# Patient Record
Sex: Female | Born: 1970 | Race: White | Hispanic: No | Marital: Single | Smoking: Current every day smoker
Health system: Southern US, Community
[De-identification: ages and names within clinical notes are randomized; demographics above are authoritative.]

## PROBLEM LIST (undated history)

## (undated) ENCOUNTER — Emergency Department (HOSPITAL_BASED_OUTPATIENT_CLINIC_OR_DEPARTMENT_OTHER): Payer: Medicare Other | Source: Home / Self Care

## (undated) DIAGNOSIS — K219 Gastro-esophageal reflux disease without esophagitis: Secondary | ICD-10-CM

## (undated) DIAGNOSIS — F329 Major depressive disorder, single episode, unspecified: Secondary | ICD-10-CM

## (undated) DIAGNOSIS — M1990 Unspecified osteoarthritis, unspecified site: Secondary | ICD-10-CM

## (undated) DIAGNOSIS — I2699 Other pulmonary embolism without acute cor pulmonale: Secondary | ICD-10-CM

## (undated) DIAGNOSIS — T4145XA Adverse effect of unspecified anesthetic, initial encounter: Secondary | ICD-10-CM

## (undated) DIAGNOSIS — Z9109 Other allergy status, other than to drugs and biological substances: Secondary | ICD-10-CM

## (undated) DIAGNOSIS — G5600 Carpal tunnel syndrome, unspecified upper limb: Secondary | ICD-10-CM

## (undated) DIAGNOSIS — IMO0002 Reserved for concepts with insufficient information to code with codable children: Secondary | ICD-10-CM

## (undated) DIAGNOSIS — T8859XA Other complications of anesthesia, initial encounter: Secondary | ICD-10-CM

## (undated) DIAGNOSIS — I7300 Raynaud's syndrome without gangrene: Secondary | ICD-10-CM

## (undated) DIAGNOSIS — I8290 Acute embolism and thrombosis of unspecified vein: Secondary | ICD-10-CM

## (undated) DIAGNOSIS — I82409 Acute embolism and thrombosis of unspecified deep veins of unspecified lower extremity: Secondary | ICD-10-CM

## (undated) DIAGNOSIS — M5136 Other intervertebral disc degeneration, lumbar region: Secondary | ICD-10-CM

## (undated) DIAGNOSIS — F32A Depression, unspecified: Secondary | ICD-10-CM

## (undated) DIAGNOSIS — F319 Bipolar disorder, unspecified: Secondary | ICD-10-CM

## (undated) DIAGNOSIS — I10 Essential (primary) hypertension: Secondary | ICD-10-CM

## (undated) DIAGNOSIS — Z8709 Personal history of other diseases of the respiratory system: Secondary | ICD-10-CM

## (undated) DIAGNOSIS — F988 Other specified behavioral and emotional disorders with onset usually occurring in childhood and adolescence: Secondary | ICD-10-CM

## (undated) DIAGNOSIS — F419 Anxiety disorder, unspecified: Secondary | ICD-10-CM

## (undated) DIAGNOSIS — B019 Varicella without complication: Secondary | ICD-10-CM

## (undated) DIAGNOSIS — W3400XA Accidental discharge from unspecified firearms or gun, initial encounter: Secondary | ICD-10-CM

## (undated) DIAGNOSIS — D649 Anemia, unspecified: Secondary | ICD-10-CM

## (undated) DIAGNOSIS — M51369 Other intervertebral disc degeneration, lumbar region without mention of lumbar back pain or lower extremity pain: Secondary | ICD-10-CM

## (undated) DIAGNOSIS — K5900 Constipation, unspecified: Secondary | ICD-10-CM

## (undated) HISTORY — PX: TUBAL LIGATION: SHX77

## (undated) HISTORY — PX: COCCYGECTOMY: SHX5011

## (undated) HISTORY — PX: OTHER SURGICAL HISTORY: SHX169

## (undated) HISTORY — PX: BREAST BIOPSY: SHX20

## (undated) HISTORY — DX: Acute embolism and thrombosis of unspecified vein: I82.90

## (undated) HISTORY — DX: Other pulmonary embolism without acute cor pulmonale: I26.99

## (undated) HISTORY — DX: Raynaud's syndrome without gangrene: I73.00

## (undated) HISTORY — DX: Carpal tunnel syndrome, unspecified upper limb: G56.00

## (undated) HISTORY — DX: Other allergy status, other than to drugs and biological substances: Z91.09

## (undated) HISTORY — DX: Varicella without complication: B01.9

## (undated) HISTORY — DX: Acute embolism and thrombosis of unspecified deep veins of unspecified lower extremity: I82.409

## (undated) HISTORY — DX: Unspecified osteoarthritis, unspecified site: M19.90

---

## 2011-07-18 LAB — HM MAMMOGRAPHY

## 2011-09-28 LAB — HM PAP SMEAR

## 2012-09-27 DIAGNOSIS — W3400XA Accidental discharge from unspecified firearms or gun, initial encounter: Secondary | ICD-10-CM

## 2012-09-27 HISTORY — DX: Accidental discharge from unspecified firearms or gun, initial encounter: W34.00XA

## 2013-03-26 DIAGNOSIS — I742 Embolism and thrombosis of arteries of the upper extremities: Secondary | ICD-10-CM | POA: Insufficient documentation

## 2013-03-26 DIAGNOSIS — F17200 Nicotine dependence, unspecified, uncomplicated: Secondary | ICD-10-CM | POA: Insufficient documentation

## 2013-06-25 ENCOUNTER — Emergency Department (HOSPITAL_COMMUNITY)
Admission: EM | Admit: 2013-06-25 | Discharge: 2013-06-25 | Discharge status: Against Medical Advice | Payer: Medicare Other | Source: Home / Self Care

## 2013-06-25 ENCOUNTER — Emergency Department (HOSPITAL_COMMUNITY)
Admission: EM | Admit: 2013-06-25 | Discharge: 2013-06-26 | Disposition: A | Discharge status: Home / Self Care | Payer: Medicare Other | Attending: Emergency Medicine | Admitting: Emergency Medicine

## 2013-06-25 ENCOUNTER — Encounter (HOSPITAL_COMMUNITY): Payer: Self-pay | Admitting: Family Medicine

## 2013-06-25 ENCOUNTER — Encounter (HOSPITAL_COMMUNITY): Payer: Self-pay | Admitting: *Deleted

## 2013-06-25 ENCOUNTER — Emergency Department (HOSPITAL_COMMUNITY): Payer: Medicare Other

## 2013-06-25 DIAGNOSIS — S301XXA Contusion of abdominal wall, initial encounter: Secondary | ICD-10-CM | POA: Insufficient documentation

## 2013-06-25 DIAGNOSIS — F17200 Nicotine dependence, unspecified, uncomplicated: Secondary | ICD-10-CM | POA: Insufficient documentation

## 2013-06-25 DIAGNOSIS — IMO0002 Reserved for concepts with insufficient information to code with codable children: Secondary | ICD-10-CM | POA: Insufficient documentation

## 2013-06-25 DIAGNOSIS — Z8739 Personal history of other diseases of the musculoskeletal system and connective tissue: Secondary | ICD-10-CM | POA: Insufficient documentation

## 2013-06-25 DIAGNOSIS — F329 Major depressive disorder, single episode, unspecified: Secondary | ICD-10-CM | POA: Insufficient documentation

## 2013-06-25 DIAGNOSIS — T07XXXA Unspecified multiple injuries, initial encounter: Secondary | ICD-10-CM

## 2013-06-25 DIAGNOSIS — Z79899 Other long term (current) drug therapy: Secondary | ICD-10-CM | POA: Insufficient documentation

## 2013-06-25 DIAGNOSIS — F3289 Other specified depressive episodes: Secondary | ICD-10-CM | POA: Insufficient documentation

## 2013-06-25 DIAGNOSIS — Y92410 Unspecified street and highway as the place of occurrence of the external cause: Secondary | ICD-10-CM | POA: Insufficient documentation

## 2013-06-25 DIAGNOSIS — S161XXA Strain of muscle, fascia and tendon at neck level, initial encounter: Secondary | ICD-10-CM

## 2013-06-25 DIAGNOSIS — Z7902 Long term (current) use of antithrombotics/antiplatelets: Secondary | ICD-10-CM | POA: Insufficient documentation

## 2013-06-25 DIAGNOSIS — R110 Nausea: Secondary | ICD-10-CM | POA: Insufficient documentation

## 2013-06-25 DIAGNOSIS — S139XXA Sprain of joints and ligaments of unspecified parts of neck, initial encounter: Secondary | ICD-10-CM | POA: Insufficient documentation

## 2013-06-25 DIAGNOSIS — Y939 Activity, unspecified: Secondary | ICD-10-CM | POA: Insufficient documentation

## 2013-06-25 DIAGNOSIS — S0083XA Contusion of other part of head, initial encounter: Secondary | ICD-10-CM

## 2013-06-25 DIAGNOSIS — M542 Cervicalgia: Secondary | ICD-10-CM | POA: Insufficient documentation

## 2013-06-25 DIAGNOSIS — S0003XA Contusion of scalp, initial encounter: Secondary | ICD-10-CM | POA: Insufficient documentation

## 2013-06-25 DIAGNOSIS — Y9389 Activity, other specified: Secondary | ICD-10-CM | POA: Insufficient documentation

## 2013-06-25 DIAGNOSIS — R000 Tachycardia, unspecified: Secondary | ICD-10-CM | POA: Insufficient documentation

## 2013-06-25 DIAGNOSIS — Z7982 Long term (current) use of aspirin: Secondary | ICD-10-CM | POA: Insufficient documentation

## 2013-06-25 HISTORY — DX: Depression, unspecified: F32.A

## 2013-06-25 HISTORY — DX: Major depressive disorder, single episode, unspecified: F32.9

## 2013-06-25 HISTORY — DX: Reserved for concepts with insufficient information to code with codable children: IMO0002

## 2013-06-25 LAB — POCT I-STAT, CHEM 8
BUN: 17 mg/dL (ref 6–23)
Calcium, Ion: 1.13 mmol/L (ref 1.12–1.23)
Creatinine, Ser: 0.8 mg/dL (ref 0.50–1.10)
TCO2: 22 mmol/L (ref 0–100)

## 2013-06-25 MED ORDER — HYDROMORPHONE HCL PF 1 MG/ML IJ SOLN
1.0000 mg | Freq: Once | INTRAMUSCULAR | Status: AC
Start: 2013-06-25 — End: 2013-06-25
  Administered 2013-06-25: 1 mg via INTRAMUSCULAR

## 2013-06-25 MED ORDER — HYDROMORPHONE HCL PF 1 MG/ML IJ SOLN
1.0000 mg | Freq: Once | INTRAMUSCULAR | Status: DC
  Filled 2013-06-25: qty 1

## 2013-06-25 MED ORDER — ONDANSETRON HCL 4 MG/2ML IJ SOLN: 4.0000 mg | Freq: Once | INTRAMUSCULAR | Status: DC

## 2013-06-25 MED ORDER — ONDANSETRON 4 MG PO TBDP
4.0000 mg | ORAL_TABLET | Freq: Once | ORAL | Status: AC
  Administered 2013-06-25: 4 mg via ORAL
  Filled 2013-06-25: qty 1
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
## 2013-06-25 NOTE — ED Notes (Signed)
Attempted to draw pts labs was unsuccessful called lab  

## 2013-06-25 NOTE — ED Notes (Signed)
Pt eating crackers and drinking iced coffee.  Instructed pt that she should not be eating or drinking until seen by MD.
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
## 2013-06-26 ENCOUNTER — Telehealth: Payer: Self-pay | Admitting: Family Medicine

## 2013-06-26 LAB — CBC WITH DIFFERENTIAL/PLATELET
Basophils Absolute: 0 10*3/uL (ref 0.0–0.1)
Basophils Relative: 0 % (ref 0–1)
Eosinophils Relative: 1 % (ref 0–5)
HCT: 38.9 % (ref 36.0–46.0)
Lymphocytes Relative: 27 % (ref 12–46)
Lymphs Abs: 2.8 10*3/uL (ref 0.7–4.0)
MCH: 31.6 pg (ref 26.0–34.0)
MCHC: 34.2 g/dL (ref 30.0–36.0)
MCV: 92.4 fL (ref 78.0–100.0)
Monocytes Absolute: 0.6 10*3/uL (ref 0.1–1.0)
RBC: 4.21 MIL/uL (ref 3.87–5.11)
RDW: 13.9 % (ref 11.5–15.5)
WBC: 10.2 10*3/uL (ref 4.0–10.5)

## 2013-06-26 MED ORDER — OXYCODONE-ACETAMINOPHEN 5-325 MG PO TABS
2.0000 | ORAL_TABLET | Freq: Once | ORAL | Status: AC
  Administered 2013-06-26: 2 via ORAL
  Filled 2013-06-26: qty 2

## 2013-06-26 MED ORDER — CARISOPRODOL 350 MG PO TABS: 350.0000 mg | ORAL_TABLET | Freq: Three times a day (TID) | ORAL | Status: DC | PRN

## 2013-06-26 MED ORDER — CARISOPRODOL 350 MG PO TABS
350.0000 mg | ORAL_TABLET | Freq: Once | ORAL | Status: AC
  Administered 2013-06-26: 350 mg via ORAL
  Filled 2013-06-26: qty 1

## 2013-06-26 NOTE — ED Notes (Signed)
Pt  Refused urine specimen gail np made aware

## 2013-06-26 NOTE — Telephone Encounter (Signed)
Received 5 pages from [facility], sent to [facility] on 06/26/13/ss

## 2013-06-27 ENCOUNTER — Telehealth: Payer: Self-pay

## 2013-06-27 NOTE — Telephone Encounter (Signed)
LM for CB No HM/need records.ROI

## 2013-06-28 ENCOUNTER — Ambulatory Visit: Payer: Self-pay | Admitting: Family Medicine

## 2013-06-28 DIAGNOSIS — Z0289 Encounter for other administrative examinations: Secondary | ICD-10-CM

## 2013-06-28 NOTE — ED Provider Notes (Signed)
Medical screening examination/treatment/procedure(s) were performed by non-physician practitioner and as supervising physician I was immediately available for consultation/collaboration.   Dagmar Hait, MD 06/28/13 [phone]

## 2013-06-28 NOTE — Telephone Encounter (Signed)
Unable to reach Pre-Visit.   

## 2013-07-01 ENCOUNTER — Encounter (HOSPITAL_COMMUNITY): Payer: Self-pay | Admitting: Emergency Medicine

## 2013-07-01 ENCOUNTER — Emergency Department (HOSPITAL_COMMUNITY)
Admission: EM | Admit: 2013-07-01 | Discharge: 2013-07-01 | Disposition: A | Discharge status: Home / Self Care | Payer: Medicare Other | Attending: Emergency Medicine | Admitting: Emergency Medicine

## 2013-07-01 DIAGNOSIS — Z7902 Long term (current) use of antithrombotics/antiplatelets: Secondary | ICD-10-CM | POA: Insufficient documentation

## 2013-07-01 DIAGNOSIS — Y939 Activity, unspecified: Secondary | ICD-10-CM | POA: Insufficient documentation

## 2013-07-01 DIAGNOSIS — S71009A Unspecified open wound, unspecified hip, initial encounter: Secondary | ICD-10-CM | POA: Insufficient documentation

## 2013-07-01 DIAGNOSIS — F17200 Nicotine dependence, unspecified, uncomplicated: Secondary | ICD-10-CM | POA: Insufficient documentation

## 2013-07-01 DIAGNOSIS — F3289 Other specified depressive episodes: Secondary | ICD-10-CM | POA: Insufficient documentation

## 2013-07-01 DIAGNOSIS — Z79899 Other long term (current) drug therapy: Secondary | ICD-10-CM | POA: Insufficient documentation

## 2013-07-01 DIAGNOSIS — Y929 Unspecified place or not applicable: Secondary | ICD-10-CM | POA: Insufficient documentation

## 2013-07-01 DIAGNOSIS — Z8739 Personal history of other diseases of the musculoskeletal system and connective tissue: Secondary | ICD-10-CM | POA: Insufficient documentation

## 2013-07-01 DIAGNOSIS — F329 Major depressive disorder, single episode, unspecified: Secondary | ICD-10-CM | POA: Insufficient documentation

## 2013-07-01 DIAGNOSIS — T798XXA Other early complications of trauma, initial encounter: Secondary | ICD-10-CM

## 2013-07-01 DIAGNOSIS — W3400XA Accidental discharge from unspecified firearms or gun, initial encounter: Secondary | ICD-10-CM | POA: Insufficient documentation

## 2013-07-01 DIAGNOSIS — S71109A Unspecified open wound, unspecified thigh, initial encounter: Secondary | ICD-10-CM | POA: Insufficient documentation

## 2013-07-01 DIAGNOSIS — Z7982 Long term (current) use of aspirin: Secondary | ICD-10-CM | POA: Insufficient documentation

## 2013-07-01 MED ORDER — SULFAMETHOXAZOLE-TMP DS 800-160 MG PO TABS: 1.0000 | ORAL_TABLET | Freq: Two times a day (BID) | ORAL | Status: DC

## 2013-07-01 MED ORDER — CEPHALEXIN 500 MG PO CAPS: ORAL_CAPSULE | ORAL | Status: DC

## 2013-07-01 MED ORDER — FLUCONAZOLE 200 MG PO TABS: 200.0000 mg | ORAL_TABLET | Freq: Every day | ORAL | Status: DC
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
## 2013-07-01 NOTE — ED Notes (Signed)
New dressing applied to wound.
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
## 2013-07-10 ENCOUNTER — Encounter (HOSPITAL_BASED_OUTPATIENT_CLINIC_OR_DEPARTMENT_OTHER): Payer: Self-pay | Admitting: Emergency Medicine

## 2013-07-10 ENCOUNTER — Emergency Department (HOSPITAL_BASED_OUTPATIENT_CLINIC_OR_DEPARTMENT_OTHER)
Admission: EM | Admit: 2013-07-10 | Discharge: 2013-07-10 | Disposition: A | Discharge status: Home / Self Care | Payer: Medicare Other | Attending: Emergency Medicine | Admitting: Emergency Medicine

## 2013-07-10 DIAGNOSIS — F17200 Nicotine dependence, unspecified, uncomplicated: Secondary | ICD-10-CM | POA: Insufficient documentation

## 2013-07-10 DIAGNOSIS — Z8739 Personal history of other diseases of the musculoskeletal system and connective tissue: Secondary | ICD-10-CM | POA: Insufficient documentation

## 2013-07-10 DIAGNOSIS — Z4801 Encounter for change or removal of surgical wound dressing: Secondary | ICD-10-CM | POA: Insufficient documentation

## 2013-07-10 DIAGNOSIS — Z7902 Long term (current) use of antithrombotics/antiplatelets: Secondary | ICD-10-CM | POA: Insufficient documentation

## 2013-07-10 DIAGNOSIS — R269 Unspecified abnormalities of gait and mobility: Secondary | ICD-10-CM | POA: Insufficient documentation

## 2013-07-10 DIAGNOSIS — Z7982 Long term (current) use of aspirin: Secondary | ICD-10-CM | POA: Insufficient documentation

## 2013-07-10 DIAGNOSIS — Z79899 Other long term (current) drug therapy: Secondary | ICD-10-CM | POA: Insufficient documentation

## 2013-07-10 DIAGNOSIS — Z5189 Encounter for other specified aftercare: Secondary | ICD-10-CM

## 2013-07-10 HISTORY — DX: Accidental discharge from unspecified firearms or gun, initial encounter: W34.00XA

## 2013-07-10 LAB — CBC WITH DIFFERENTIAL/PLATELET
Basophils Absolute: 0 10*3/uL (ref 0.0–0.1)
Basophils Relative: 1 % (ref 0–1)
Eosinophils Absolute: 0.1 10*3/uL (ref 0.0–0.7)
MCH: 31.3 pg (ref 26.0–34.0)
MCHC: 33.2 g/dL (ref 30.0–36.0)
Neutrophils Relative %: 48 % (ref 43–77)
Platelets: 367 10*3/uL (ref 150–400)
RDW: 13.2 % (ref 11.5–15.5)

## 2013-07-10 LAB — BASIC METABOLIC PANEL
BUN: 14 mg/dL (ref 6–23)
Calcium: 9.3 mg/dL (ref 8.4–10.5)
Creatinine, Ser: 0.8 mg/dL (ref 0.50–1.10)
GFR calc non Af Amer: 88 mL/min — ABNORMAL LOW (ref >90)
Glucose, Bld: 78 mg/dL (ref 70–99)
Sodium: 137 mEq/L (ref 135–145)

## 2013-07-10 MED ORDER — OXYCODONE-ACETAMINOPHEN 5-325 MG PO TABS: 1.0000 | ORAL_TABLET | Freq: Four times a day (QID) | ORAL | Status: DC | PRN
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
## 2013-07-10 NOTE — ED Notes (Signed)
MD at bedside. 
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
## 2013-07-12 ENCOUNTER — Telehealth (HOSPITAL_COMMUNITY): Payer: Self-pay | Admitting: Emergency Medicine
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
## 2013-07-13 ENCOUNTER — Telehealth: Payer: Self-pay

## 2013-07-13 NOTE — Telephone Encounter (Addendum)
LM fo CB Unable to reach prior to visit

## 2013-07-16 NOTE — ED Provider Notes (Signed)
Medical screening examination/treatment/procedure(s) were conducted as a shared visit with non-physician practitioner(s) and myself.  I personally evaluated the patient during the encounter Pt with draining wound without signs of infection or abscess.  Recently completed abx.  Pt has been standing for 16 hours a day and continues to pack the wound.  Will continue to pack and send to trauma clinic  Gwyneth Sprout, MD 07/16/13 [phone]

## 2013-07-17 ENCOUNTER — Ambulatory Visit (INDEPENDENT_AMBULATORY_CARE_PROVIDER_SITE_OTHER): Payer: Medicare Other | Admitting: Family Medicine

## 2013-07-17 ENCOUNTER — Encounter: Payer: Self-pay | Admitting: Family Medicine

## 2013-07-17 VITALS — BP 100/68 | HR 94 | Temp 98.2°F | Ht 69.5 in | Wt 161.4 lb

## 2013-07-17 DIAGNOSIS — F411 Generalized anxiety disorder: Secondary | ICD-10-CM

## 2013-07-17 DIAGNOSIS — G894 Chronic pain syndrome: Secondary | ICD-10-CM

## 2013-07-17 DIAGNOSIS — I7300 Raynaud's syndrome without gangrene: Secondary | ICD-10-CM

## 2013-07-17 DIAGNOSIS — G5600 Carpal tunnel syndrome, unspecified upper limb: Secondary | ICD-10-CM

## 2013-07-17 DIAGNOSIS — F988 Other specified behavioral and emotional disorders with onset usually occurring in childhood and adolescence: Secondary | ICD-10-CM

## 2013-07-17 DIAGNOSIS — M549 Dorsalgia, unspecified: Secondary | ICD-10-CM

## 2013-07-17 DIAGNOSIS — B009 Herpesviral infection, unspecified: Secondary | ICD-10-CM

## 2013-07-17 MED ORDER — OXYCODONE HCL ER 20 MG PO T12A: 20.0000 mg | EXTENDED_RELEASE_TABLET | Freq: Three times a day (TID) | ORAL | Status: DC

## 2013-07-17 MED ORDER — ALPRAZOLAM 1 MG PO TABS: 1.0000 mg | ORAL_TABLET | Freq: Three times a day (TID) | ORAL | Status: DC

## 2013-07-17 MED ORDER — LAMOTRIGINE 200 MG PO TABS: 400.0000 mg | ORAL_TABLET | Freq: Every day | ORAL | Status: AC

## 2013-07-17 MED ORDER — CARISOPRODOL 350 MG PO TABS: 350.0000 mg | ORAL_TABLET | Freq: Three times a day (TID) | ORAL | Status: DC | PRN

## 2013-07-17 MED ORDER — AMLODIPINE BESYLATE 2.5 MG PO TABS: 2.5000 mg | ORAL_TABLET | Freq: Every day | ORAL | Status: DC

## 2013-07-17 MED ORDER — AMPHETAMINE-DEXTROAMPHETAMINE 20 MG PO TABS: 20.0000 mg | ORAL_TABLET | Freq: Two times a day (BID) | ORAL | Status: DC

## 2013-07-17 MED ORDER — CLOPIDOGREL BISULFATE 75 MG PO TABS: 75.0000 mg | ORAL_TABLET | Freq: Every day | ORAL | Status: DC

## 2013-07-17 MED ORDER — TEMAZEPAM 7.5 MG PO CAPS: 7.5000 mg | ORAL_CAPSULE | Freq: Every evening | ORAL | Status: AC | PRN

## 2013-07-17 MED ORDER — VALACYCLOVIR HCL 500 MG PO TABS: 500.0000 mg | ORAL_TABLET | Freq: Every day | ORAL | Status: AC

## 2013-07-17 MED ORDER — OXYCODONE-ACETAMINOPHEN 10-325 MG PO TABS: 1.0000 | ORAL_TABLET | Freq: Three times a day (TID) | ORAL | Status: DC | PRN

## 2013-07-17 NOTE — Assessment & Plan Note (Signed)
con't splints Refer to hand surgery
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
## 2013-07-17 NOTE — Assessment & Plan Note (Addendum)
Refill meds Refer to pain management

## 2013-07-17 NOTE — Patient Instructions (Signed)
Carpal Tunnel Syndrome You may have carpal tunnel syndrome. This is a common condition. Carpal tunnel syndrome occurs when the tendons, bones, or ligaments in the wrist press against the median nerve as it passes into the hand.  Symptoms can include:  Intermittent numbness.   Pain or a tingling sensation in thumb and first two fingers.  The pain may radiate up to the shoulder. There may even be weakness in the hand muscles. The pain is often worse at night and in the early morning. Nerve conduction tests may be used to prove the diagnosis. Carpal tunnel syndrome is most often due to repeated movements of the hand or wrist. Other causes can include:  Prior injuries.   Diabetes.   Obesity.   Smoking.   Pregnancy. Symptoms that develop during pregnancy often stop when the pregnancy is over.  Treatment includes:  Splinting - A wrist splint helps prevent movements that irritate the nerve. Splints are especially helpful at night when the symptoms are often worse.   Ice packs - Cold packs applied to the palm side of the wrist for 20 minutes every 2 hours while awake may give some relief.   Medication - Medicine to reduce inflammation and pain are often used. Cortisone injections around the nerve may also bring improvement.  Severe cases of carpal tunnel syndrome can require surgery to relieve the pressure on the nerve. This may be necessary if there is evidence of weakness or decreased sensation in your hand, or if your symptoms do not improve with conservative treatment. See your caregiver for follow-up to be certain your condition is improving. Document Released: 10/21/2004 Document Revised: 05/26/2011 Document Reviewed: 07/20/2007 [employer] Patient Information 2012 [employer]. 

## 2013-07-17 NOTE — Assessment & Plan Note (Signed)
Cont norvasc 

## 2013-07-25 ENCOUNTER — Other Ambulatory Visit: Payer: Self-pay | Admitting: Family Medicine

## 2013-07-25 DIAGNOSIS — M549 Dorsalgia, unspecified: Secondary | ICD-10-CM

## 2013-08-16 ENCOUNTER — Ambulatory Visit: Payer: Medicare Other | Admitting: Family Medicine

## 2013-08-16 ENCOUNTER — Telehealth: Payer: Self-pay | Admitting: *Deleted

## 2013-08-16 DIAGNOSIS — Z0289 Encounter for other administrative examinations: Secondary | ICD-10-CM

## 2013-08-16 NOTE — Telephone Encounter (Signed)
Spoke with pt after she missed her appt with Dr. Laury Axon today for medication follow up. Patient arrived over 30 minutes late to the appt and wasd told she needed to reschedule until tomorrow. Pt stated that she could not come back tomorrow due to her work schedule. Pt then asked if she could sit and wait in the lobby in the event another pt called and cancelled their appt, I advised that could not happen as we do not accept walk-in appts here. Patient then asked if she could come in the morning and sit and wait for a possible cancellation, I again advised no. I advised the patient that long term medications that are presribed regularly need to be monitored by the prescribing provider, it was not appropriate to have another physician do this (pt was new to establish in Oct w/ Dr. Laury Axon). Patient then asked if she changed providers could she get her medication today. I advised she could change providers that Dr. Laury Axon would no longer prescribe these medications for her and would no longer be her PCP. Patient verbalized understanding of this. Patient then stated that she would go back up front and make an appt with another provider.

## 2013-08-17 ENCOUNTER — Encounter: Payer: Self-pay | Admitting: Family Medicine

## 2013-08-17 ENCOUNTER — Encounter: Payer: Self-pay | Admitting: *Deleted

## 2013-08-17 ENCOUNTER — Ambulatory Visit (INDEPENDENT_AMBULATORY_CARE_PROVIDER_SITE_OTHER): Payer: Medicare Other | Admitting: Physician Assistant

## 2013-08-17 ENCOUNTER — Encounter: Payer: Self-pay | Admitting: Physician Assistant

## 2013-08-17 VITALS — BP 124/88 | HR 86 | Temp 98.2°F | Resp 16 | Ht 68.0 in | Wt 169.0 lb

## 2013-08-17 DIAGNOSIS — M549 Dorsalgia, unspecified: Secondary | ICD-10-CM

## 2013-08-17 DIAGNOSIS — Z79891 Long term (current) use of opiate analgesic: Secondary | ICD-10-CM

## 2013-08-17 DIAGNOSIS — J329 Chronic sinusitis, unspecified: Secondary | ICD-10-CM

## 2013-08-17 DIAGNOSIS — F988 Other specified behavioral and emotional disorders with onset usually occurring in childhood and adolescence: Secondary | ICD-10-CM

## 2013-08-17 DIAGNOSIS — G5600 Carpal tunnel syndrome, unspecified upper limb: Secondary | ICD-10-CM

## 2013-08-17 DIAGNOSIS — Z79899 Other long term (current) drug therapy: Secondary | ICD-10-CM

## 2013-08-17 DIAGNOSIS — Z0000 Encounter for general adult medical examination without abnormal findings: Secondary | ICD-10-CM

## 2013-08-17 DIAGNOSIS — I7300 Raynaud's syndrome without gangrene: Secondary | ICD-10-CM

## 2013-08-17 DIAGNOSIS — G894 Chronic pain syndrome: Secondary | ICD-10-CM

## 2013-08-17 LAB — URINALYSIS, ROUTINE W REFLEX MICROSCOPIC
Bilirubin Urine: NEGATIVE
Ketones, ur: NEGATIVE mg/dL
Leukocytes, UA: NEGATIVE
Nitrite: NEGATIVE
Specific Gravity, Urine: 1.007 (ref 1.005–1.030)
Urobilinogen, UA: 0.2 mg/dL (ref 0.0–1.0)

## 2013-08-17 LAB — POCT URINALYSIS DIPSTICK
Ketones, UA: NEGATIVE
Leukocytes, UA: NEGATIVE
Nitrite, UA: NEGATIVE
Protein, UA: NEGATIVE
Urobilinogen, UA: 0.2
pH, UA: 7

## 2013-08-17 MED ORDER — AMPHETAMINE-DEXTROAMPHETAMINE 20 MG PO TABS: 20.0000 mg | ORAL_TABLET | Freq: Two times a day (BID) | ORAL | Status: DC

## 2013-08-17 MED ORDER — OXYCODONE HCL ER 20 MG PO T12A: 20.0000 mg | EXTENDED_RELEASE_TABLET | Freq: Two times a day (BID) | ORAL | Status: DC

## 2013-08-17 MED ORDER — ALBUTEROL SULFATE HFA 108 (90 BASE) MCG/ACT IN AERS: 2.0000 | INHALATION_SPRAY | Freq: Four times a day (QID) | RESPIRATORY_TRACT | Status: AC | PRN

## 2013-08-17 MED ORDER — AMOXICILLIN-POT CLAVULANATE 875-125 MG PO TABS: 1.0000 | ORAL_TABLET | Freq: Two times a day (BID) | ORAL | Status: DC

## 2013-08-17 MED ORDER — OXYCODONE HCL ER 20 MG PO T12A: 20.0000 mg | EXTENDED_RELEASE_TABLET | Freq: Three times a day (TID) | ORAL | Status: DC

## 2013-08-17 MED ORDER — OXYCODONE-ACETAMINOPHEN 10-325 MG PO TABS: 1.0000 | ORAL_TABLET | Freq: Three times a day (TID) | ORAL | Status: DC | PRN

## 2013-08-17 MED ORDER — NAPROXEN 500 MG PO TABS: 500.0000 mg | ORAL_TABLET | Freq: Two times a day (BID) | ORAL | Status: DC | PRN

## 2013-08-17 NOTE — Progress Notes (Signed)
Pre visit review using our clinic review tool, if applicable. No additional management support is needed unless otherwise documented below in the visit note/SLS  

## 2013-08-17 NOTE — Patient Instructions (Signed)
Please obtain labs.  I will call you with your results.  Please follow-up with Orthopedics as scheduled.  I will check into your pain management referral.  We do not do chronic pain management so future pain medications will need to come from Ortho or Pain management Specialist.
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
## 2013-08-22 DIAGNOSIS — F988 Other specified behavioral and emotional disorders with onset usually occurring in childhood and adolescence: Secondary | ICD-10-CM | POA: Insufficient documentation

## 2013-08-22 DIAGNOSIS — Z0000 Encounter for general adult medical examination without abnormal findings: Secondary | ICD-10-CM | POA: Insufficient documentation

## 2013-08-22 DIAGNOSIS — J329 Chronic sinusitis, unspecified: Secondary | ICD-10-CM | POA: Insufficient documentation

## 2013-08-22 NOTE — Assessment & Plan Note (Signed)
Refill Adderall.  Patient filled out Controlled-substance agreement with our practice.

## 2013-08-22 NOTE — Assessment & Plan Note (Signed)
Will obtain labs.  Immunizations UTD.

## 2013-08-22 NOTE — Assessment & Plan Note (Signed)
Asymptomatic at present.  Has appointment with hand surgeon.

## 2013-08-22 NOTE — Assessment & Plan Note (Signed)
Follow-up with orthopedics.  Refilled meds.  Patient informed we do not do chronic pain management with narcotics at our practice.  Will possibly need pain management referral.

## 2013-08-22 NOTE — Assessment & Plan Note (Signed)
Rx Augmentin.  Increase fluids.  Rest.  Saline nasal spray.  Probiotic.  Humidifier in bedroom.

## 2013-08-22 NOTE — Assessment & Plan Note (Signed)
Continue current meds 

## 2013-09-14 ENCOUNTER — Ambulatory Visit: Payer: Medicare Other | Admitting: Physician Assistant

## 2013-09-14 ENCOUNTER — Other Ambulatory Visit: Payer: Self-pay | Admitting: Physician Assistant

## 2013-09-14 LAB — COMPREHENSIVE METABOLIC PANEL
ALT: 15 U/L (ref 0–35)
AST: 17 U/L (ref 0–37)
Albumin: 4.1 g/dL (ref 3.5–5.2)
Alkaline Phosphatase: 53 U/L (ref 39–117)
BUN: 11 mg/dL (ref 6–23)
CO2: 26 mEq/L (ref 19–32)
Calcium: 8.9 mg/dL (ref 8.4–10.5)
Chloride: 104 mEq/L (ref 96–112)
Creat: 0.72 mg/dL (ref 0.50–1.10)
Potassium: 4.2 mEq/L (ref 3.5–5.3)
Total Bilirubin: 0.5 mg/dL (ref 0.3–1.2)

## 2013-09-14 LAB — CBC WITH DIFFERENTIAL/PLATELET
Basophils Absolute: 0 10*3/uL (ref 0.0–0.1)
Basophils Relative: 1 % (ref 0–1)
Eosinophils Absolute: 0.1 10*3/uL (ref 0.0–0.7)
Lymphocytes Relative: 30 % (ref 12–46)
MCHC: 33.7 g/dL (ref 30.0–36.0)
Monocytes Absolute: 0.5 10*3/uL (ref 0.1–1.0)
Monocytes Relative: 7 % (ref 3–12)
Neutro Abs: 4 10*3/uL (ref 1.7–7.7)
Platelets: 265 10*3/uL (ref 150–400)
RDW: 14.2 % (ref 11.5–15.5)
WBC: 6.5 10*3/uL (ref 4.0–10.5)

## 2013-09-17 ENCOUNTER — Ambulatory Visit: Payer: Medicare Other | Admitting: Physician Assistant

## 2013-09-17 ENCOUNTER — Ambulatory Visit (INDEPENDENT_AMBULATORY_CARE_PROVIDER_SITE_OTHER): Payer: Medicare Other | Admitting: Physician Assistant

## 2013-09-17 ENCOUNTER — Encounter: Payer: Self-pay | Admitting: Physician Assistant

## 2013-09-17 VITALS — BP 128/82 | HR 91 | Temp 98.6°F | Resp 14 | Ht 68.0 in | Wt 160.5 lb

## 2013-09-17 DIAGNOSIS — F411 Generalized anxiety disorder: Secondary | ICD-10-CM

## 2013-09-17 DIAGNOSIS — M549 Dorsalgia, unspecified: Secondary | ICD-10-CM

## 2013-09-17 DIAGNOSIS — F329 Major depressive disorder, single episode, unspecified: Secondary | ICD-10-CM

## 2013-09-17 DIAGNOSIS — G5600 Carpal tunnel syndrome, unspecified upper limb: Secondary | ICD-10-CM

## 2013-09-17 DIAGNOSIS — F988 Other specified behavioral and emotional disorders with onset usually occurring in childhood and adolescence: Secondary | ICD-10-CM

## 2013-09-17 DIAGNOSIS — G894 Chronic pain syndrome: Secondary | ICD-10-CM

## 2013-09-17 MED ORDER — OXYCODONE HCL ER 20 MG PO T12A: 20.0000 mg | EXTENDED_RELEASE_TABLET | Freq: Two times a day (BID) | ORAL | Status: DC

## 2013-09-17 MED ORDER — ALPRAZOLAM 1 MG PO TABS: 1.0000 mg | ORAL_TABLET | Freq: Three times a day (TID) | ORAL | Status: DC

## 2013-09-17 MED ORDER — OXYCODONE-ACETAMINOPHEN 10-325 MG PO TABS: 1.0000 | ORAL_TABLET | Freq: Three times a day (TID) | ORAL | Status: DC | PRN

## 2013-09-17 NOTE — Progress Notes (Signed)
Pre visit review using our clinic review tool, if applicable. No additional management support is needed unless otherwise documented below in the visit note/SLS  
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
## 2013-09-18 ENCOUNTER — Telehealth: Payer: Self-pay | Admitting: *Deleted

## 2013-09-18 DIAGNOSIS — F988 Other specified behavioral and emotional disorders with onset usually occurring in childhood and adolescence: Secondary | ICD-10-CM

## 2013-09-18 DIAGNOSIS — F329 Major depressive disorder, single episode, unspecified: Secondary | ICD-10-CM | POA: Insufficient documentation

## 2013-09-18 NOTE — Assessment & Plan Note (Signed)
Surgery scheduled in January.

## 2013-09-18 NOTE — Assessment & Plan Note (Signed)
-   Refill Adderall

## 2013-09-18 NOTE — Assessment & Plan Note (Signed)
Refill narcotics. No additional refills will be given without specialist evaluation. Patient voices understanding. Referral to pain management placed. Patient to reschedule her appointment with orthopedic surgeon.

## 2013-09-18 NOTE — Assessment & Plan Note (Signed)
Refill Xanax. Will not prescribe the Xanax and Restoril together without specialist evaluation.

## 2013-09-18 NOTE — Telephone Encounter (Signed)
Patient requesting Adderall Rx, she forgot to get this at 12.22.14 OV/SLS Please Advise.

## 2013-09-19 MED ORDER — AMPHETAMINE-DEXTROAMPHETAMINE 20 MG PO TABS: 20.0000 mg | ORAL_TABLET | Freq: Two times a day (BID) | ORAL | Status: DC

## 2013-09-19 NOTE — Telephone Encounter (Signed)
Rx has been forwarded to front desk for pt p/u, patient is aware/SLS

## 2013-09-19 NOTE — Telephone Encounter (Signed)
Refill given, printed, signed and handed to nursing staff.

## 2013-10-01 ENCOUNTER — Other Ambulatory Visit: Payer: Self-pay | Admitting: Family Medicine

## 2013-10-18 ENCOUNTER — Encounter: Payer: Self-pay | Admitting: Physician Assistant

## 2013-10-18 ENCOUNTER — Ambulatory Visit (INDEPENDENT_AMBULATORY_CARE_PROVIDER_SITE_OTHER): Payer: Medicare Other | Admitting: Physician Assistant

## 2013-10-18 VITALS — BP 130/82 | HR 99 | Temp 98.0°F | Wt 164.8 lb

## 2013-10-18 DIAGNOSIS — F3289 Other specified depressive episodes: Secondary | ICD-10-CM

## 2013-10-18 DIAGNOSIS — F988 Other specified behavioral and emotional disorders with onset usually occurring in childhood and adolescence: Secondary | ICD-10-CM

## 2013-10-18 DIAGNOSIS — J309 Allergic rhinitis, unspecified: Secondary | ICD-10-CM

## 2013-10-18 DIAGNOSIS — G894 Chronic pain syndrome: Secondary | ICD-10-CM

## 2013-10-18 DIAGNOSIS — R21 Rash and other nonspecific skin eruption: Secondary | ICD-10-CM

## 2013-10-18 DIAGNOSIS — F329 Major depressive disorder, single episode, unspecified: Secondary | ICD-10-CM

## 2013-10-18 DIAGNOSIS — F32A Depression, unspecified: Secondary | ICD-10-CM

## 2013-10-18 MED ORDER — AMPHETAMINE-DEXTROAMPHETAMINE 20 MG PO TABS: 20.0000 mg | ORAL_TABLET | Freq: Two times a day (BID) | ORAL | Status: DC

## 2013-10-18 MED ORDER — CLOTRIMAZOLE-BETAMETHASONE 1-0.05 % EX CREA
1.0000 | TOPICAL_CREAM | Freq: Two times a day (BID) | CUTANEOUS | Status: AC
Start: 2013-10-18 — End: ?

## 2013-10-18 MED ORDER — FLUTICASONE PROPIONATE 50 MCG/ACT NA SUSP: 2.0000 | Freq: Every day | NASAL | Status: DC

## 2013-10-18 MED ORDER — OXYCODONE HCL ER 20 MG PO T12A: 20.0000 mg | EXTENDED_RELEASE_TABLET | Freq: Two times a day (BID) | ORAL | Status: DC

## 2013-10-18 MED ORDER — OXYCODONE-ACETAMINOPHEN 10-325 MG PO TABS: 1.0000 | ORAL_TABLET | Freq: Three times a day (TID) | ORAL | Status: DC | PRN

## 2013-10-24 DIAGNOSIS — G894 Chronic pain syndrome: Secondary | ICD-10-CM | POA: Insufficient documentation

## 2013-10-24 DIAGNOSIS — R21 Rash and other nonspecific skin eruption: Secondary | ICD-10-CM | POA: Insufficient documentation

## 2013-10-24 NOTE — Assessment & Plan Note (Signed)
Meds refilled.  Will refer patient to a different Pain Clinic.

## 2013-10-24 NOTE — Assessment & Plan Note (Signed)
-   Refill Adderall

## 2013-10-24 NOTE — Assessment & Plan Note (Signed)
Refill Xanax

## 2013-10-24 NOTE — Assessment & Plan Note (Signed)
Likely fungal/yeast. Rx Lotrisone cream.
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
## 2013-10-24 NOTE — Patient Instructions (Signed)
Take medications as prescribed.  Follow-up with Orthopedics.  You will be contacted by Pain Clinic for further narcotic medications.  Return to clinic if you need us.  Otherwise, follow-up in 3 months.  Chronic Pain Chronic pain can be defined as pain that is off and on and lasts for 3 6 months or longer. Many things cause chronic pain, which can make it difficult to make a diagnosis. There are many treatment options available for chronic pain. However, finding a treatment that works well for you may require trying various approaches until the right one is found. Many people benefit from a combination of two or more types of treatment to control their pain. SYMPTOMS  Chronic pain can occur anywhere in the body and can range from mild to very severe. Some types of chronic pain include:  Headache.  Low back pain.  Cancer pain.  Arthritis pain.  Neurogenic pain. This is pain resulting from damage to nerves. People with chronic pain may also have other symptoms such as:  Depression.  Anger.  Insomnia.  Anxiety. DIAGNOSIS  Your health care provider will help diagnose your condition over time. In many cases, the initial focus will be on excluding possible conditions that could be causing the pain. Depending on your symptoms, your health care provider may order tests to diagnose your condition. Some of these tests may include:   Blood tests.   CT scan.   MRI.   X-rays.   Ultrasounds.   Nerve conduction studies.  You may need to see a specialist.  TREATMENT  Finding treatment that works well may take time. You may be referred to a pain specialist. He or she may prescribe medicine or therapies, such as:   Mindful meditation or yoga.  Shots (injections) of numbing or pain-relieving medicines into the spine or area of pain.  Local electrical stimulation.  Acupuncture.   Massage therapy.   Aroma, color, light, or sound therapy.   Biofeedback.   Working with a  physical therapist to keep from getting stiff.   Regular, gentle exercise.   Cognitive or behavioral therapy.   Group support.  Sometimes, surgery may be recommended.  HOME CARE INSTRUCTIONS   Take all medicines as directed by your health care provider.   Lessen stress in your life by relaxing and doing things such as listening to calming music.   Exercise or be active as directed by your health care provider.   Eat a healthy diet and include things such as vegetables, fruits, fish, and lean meats in your diet.   Keep all follow-up appointments with your health care provider.   Attend a support group with others suffering from chronic pain. SEEK MEDICAL CARE IF:   Your pain gets worse.   You develop a new pain that was not there before.   You cannot tolerate medicines given to you by your health care provider.   You have new symptoms since your last visit with your health care provider.  SEEK IMMEDIATE MEDICAL CARE IF:   You feel weak.   You have decreased sensation or numbness.   You lose control of bowel or bladder function.   Your pain suddenly gets much worse.   You develop shaking.  You develop chills.  You develop confusion.  You develop chest pain.  You develop shortness of breath.  MAKE SURE YOU:  Understand these instructions.  Will watch your condition.  Will get help right away if you are not doing well or get worse.  Document Released: 06/05/2002 Document Revised: 05/16/2013 Document Reviewed: 03/09/2013 [facility] Patient Information 2014 [place], [place].

## 2013-10-30 ENCOUNTER — Telehealth: Payer: Self-pay | Admitting: Physician Assistant

## 2013-10-30 NOTE — Telephone Encounter (Signed)
Relevant patient education mailed to patient.  

## 2013-11-20 ENCOUNTER — Other Ambulatory Visit: Payer: Self-pay | Admitting: Physician Assistant

## 2013-11-20 DIAGNOSIS — F411 Generalized anxiety disorder: Secondary | ICD-10-CM

## 2013-11-21 NOTE — Telephone Encounter (Signed)
Refill request for Alprazolam Last filled by MD on - 09/17/13 #90 x0 Last Appt: 10/18/2013 Next Appt: none Please advise refill?

## 2013-11-21 NOTE — Telephone Encounter (Signed)
Medication refill granted.  Rx printed, signed and ready to be faxed.  Please inform patient that our office will be starting a new protocol where patient's will have to be drug screened every so often before their medication will be refilled.  This will likely start in the next few months so she should plan to come in a few days before her medications need to be refilled to allow time for test results to come back.

## 2013-11-23 NOTE — Telephone Encounter (Signed)
Patient called back regarding this. She states that she is out of all her medications. Best# [phone]

## 2013-11-23 NOTE — Telephone Encounter (Signed)
Per pharmacy, Rx has been ready since I faxed it over on Wed, 02.25.15; [facility] to inform her to contact her pharmacy/SLS

## 2013-11-25 ENCOUNTER — Other Ambulatory Visit: Payer: Self-pay | Admitting: Physician Assistant

## 2013-11-26 NOTE — Telephone Encounter (Signed)
Rx request Denied: Rx faxed & confirmed received on Febraury 25, 2015-request too soon/SLS

## 2013-11-27 ENCOUNTER — Telehealth: Payer: Self-pay | Admitting: Physician Assistant

## 2013-11-27 DIAGNOSIS — G894 Chronic pain syndrome: Secondary | ICD-10-CM

## 2013-11-27 DIAGNOSIS — F988 Other specified behavioral and emotional disorders with onset usually occurring in childhood and adolescence: Secondary | ICD-10-CM

## 2013-11-27 NOTE — Telephone Encounter (Signed)
I am out of the office today but will be back in tomorrow.  I will have her refills ready for pickup by 8:30 tomorrow morning.  I am giving her a 3 month supply of adderall (there will be three separate prescriptions that will have certain fill-on dates).  Please make patient aware that our practice will be starting urine drug screens for narcotic pain medications.  She will have to present to clinic for a urine drug screen for her next refill of medications.

## 2013-11-27 NOTE — Telephone Encounter (Signed)
She has 1 adderall and 1 percocet left.  She needs a refill of her adderall 20mg  and she needs percocet 10 mg and oxycodone 20 mg.  Please advise the patient when she can pick up thest prescriptions

## 2013-11-28 MED ORDER — OXYCODONE-ACETAMINOPHEN 10-325 MG PO TABS: 1.0000 | ORAL_TABLET | Freq: Three times a day (TID) | ORAL | Status: DC | PRN

## 2013-11-28 MED ORDER — OXYCODONE HCL ER 20 MG PO T12A: 20.0000 mg | EXTENDED_RELEASE_TABLET | Freq: Two times a day (BID) | ORAL | Status: DC

## 2013-11-28 MED ORDER — AMPHETAMINE-DEXTROAMPHETAMINE 20 MG PO TABS
20.0000 mg | ORAL_TABLET | Freq: Two times a day (BID) | ORAL | Status: DC
Start: 2013-11-28 — End: 2013-11-28

## 2013-11-28 MED ORDER — AMPHETAMINE-DEXTROAMPHETAMINE 20 MG PO TABS: 20.0000 mg | ORAL_TABLET | Freq: Two times a day (BID) | ORAL | Status: DC

## 2013-11-28 NOTE — Addendum Note (Signed)
Addended by: Marcelline MatesMARTIN, Tarea Skillman on: 11/28/2013 07:24 AM   Modules accepted: Orders

## 2013-11-28 NOTE — Telephone Encounter (Signed)
Rx at front

## 2013-11-29 ENCOUNTER — Telehealth: Payer: Self-pay | Admitting: Physician Assistant

## 2013-11-29 ENCOUNTER — Telehealth: Payer: Self-pay | Admitting: *Deleted

## 2013-11-29 ENCOUNTER — Encounter: Payer: Self-pay | Admitting: Physician Assistant

## 2013-11-29 NOTE — Telephone Encounter (Signed)
Patient called front office stating that "we were holding her prescriptions hostage & that somebody had better be in the office after 5pm for her to come pick them up" per RoletteStephanie. Called and left message on pt's phone to inform her that her Controlled Substance Contract states that she cannot pick up controlled substance medications after business hours and that her prescriptions have been available for p/u since yesterday morning for pick-up as she was informed/SLS

## 2013-11-29 NOTE — Telephone Encounter (Signed)
Patient presented to clinic after hours demanding her controlled-substance prescriptions.  Patient made aware by Burnard LeighSharon Scates, CMA that controlled substances could not be picked up after-hours per her contract.  I have looked up medication dispenses on the [place] Controlled Substance Database and it was found that patient has been obtaining medications from multiple pharmacies.  Patient also had an old prescription for Xanax prescribed by a [facility] provider filled on 10/03/13 after I had just written her a prescription on 09/17/13 for the same medication.  Patient has violated her contract and will now be discharged from our practice.  I am allowing her to pick up a month supply of her medications so that she will not be without until she finds a new doctor.  This is a courtesy and not a privilege.  She is also entitled to a month of emergency care with us if needed until she finds a new physician.  I have documentation available for review if she presents to clinic while I am away.  I will also be sending her a Certified Letter of Discharge as well as handing her a copy in person.

## 2013-12-03 ENCOUNTER — Telehealth: Payer: Self-pay | Admitting: Physician Assistant

## 2013-12-03 NOTE — Telephone Encounter (Signed)
Dismissal Letter sent by Certified Mail 12/03/2013  Received the Return Receipt showing someone picked up the Dismissal Letter 12/07/13

## 2013-12-07 ENCOUNTER — Other Ambulatory Visit: Payer: Self-pay | Admitting: Family Medicine

## 2013-12-26 ENCOUNTER — Telehealth: Payer: Self-pay | Admitting: Physician Assistant

## 2013-12-26 NOTE — Telephone Encounter (Signed)
Patient called in stating that in the dismissal letter that Malva Coganody Martin sent her that he would refill her meds one more time. She states that she needs refills of naproxen, soma, xanax, adderall, percocet, oxy, and prilosec. I informed her that we would call her when these are ready.   Also, patient will be picking up her medical records and we will need her to sign a release form to release records to her. I will print off records for patient to pick up.

## 2013-12-26 NOTE — Telephone Encounter (Signed)
I have never filled the Soma.  Patient was given month supply of Adderall and pain medications on 3/5.  I will not refill any controlled medication substances because patient violated her contract.  I was nice enough to give her a month of pain medication until she established with a new PCP.  I will refill her naproxen and prilosec if she wishes.

## 2013-12-27 MED ORDER — NAPROXEN 500 MG PO TABS: ORAL_TABLET | ORAL | Status: DC

## 2013-12-27 MED ORDER — OMEPRAZOLE 20 MG PO CPDR: DELAYED_RELEASE_CAPSULE | ORAL | Status: DC

## 2013-12-27 NOTE — Telephone Encounter (Signed)
Rx[s] Approved by provider sent to pharmacy/SLS Patient informed, understood & agreed/sls

## 2014-02-28 ENCOUNTER — Other Ambulatory Visit: Payer: Self-pay | Admitting: Family Medicine

## 2014-03-11 DIAGNOSIS — F1910 Other psychoactive substance abuse, uncomplicated: Secondary | ICD-10-CM | POA: Insufficient documentation

## 2014-03-11 DIAGNOSIS — G894 Chronic pain syndrome: Secondary | ICD-10-CM | POA: Insufficient documentation

## 2014-10-01 ENCOUNTER — Other Ambulatory Visit: Payer: Self-pay | Admitting: Orthopedic Surgery

## 2014-10-08 ENCOUNTER — Ambulatory Visit (HOSPITAL_COMMUNITY)
Admission: RE | Admit: 2014-10-08 | Discharge: 2014-10-08 | Disposition: A | Discharge status: Home / Self Care | Payer: Medicare Other | Source: Ambulatory Visit | Attending: Orthopedic Surgery | Admitting: Orthopedic Surgery

## 2014-10-08 ENCOUNTER — Other Ambulatory Visit (HOSPITAL_COMMUNITY): Payer: Self-pay | Admitting: *Deleted

## 2014-10-08 ENCOUNTER — Encounter (HOSPITAL_COMMUNITY): Payer: Self-pay

## 2014-10-08 ENCOUNTER — Encounter (HOSPITAL_COMMUNITY)
Admission: RE | Admit: 2014-10-08 | Discharge: 2014-10-08 | Disposition: A | Discharge status: Home / Self Care | Payer: Medicare Other | Source: Ambulatory Visit | Attending: Orthopedic Surgery | Admitting: Orthopedic Surgery

## 2014-10-08 DIAGNOSIS — Z01810 Encounter for preprocedural cardiovascular examination: Secondary | ICD-10-CM | POA: Diagnosis not present

## 2014-10-08 DIAGNOSIS — Z79899 Other long term (current) drug therapy: Secondary | ICD-10-CM | POA: Insufficient documentation

## 2014-10-08 DIAGNOSIS — M1612 Unilateral primary osteoarthritis, left hip: Secondary | ICD-10-CM | POA: Diagnosis not present

## 2014-10-08 DIAGNOSIS — Z01812 Encounter for preprocedural laboratory examination: Secondary | ICD-10-CM | POA: Diagnosis not present

## 2014-10-08 DIAGNOSIS — Z01818 Encounter for other preprocedural examination: Secondary | ICD-10-CM

## 2014-10-08 DIAGNOSIS — F17210 Nicotine dependence, cigarettes, uncomplicated: Secondary | ICD-10-CM | POA: Insufficient documentation

## 2014-10-08 DIAGNOSIS — I10 Essential (primary) hypertension: Secondary | ICD-10-CM | POA: Diagnosis not present

## 2014-10-08 DIAGNOSIS — J449 Chronic obstructive pulmonary disease, unspecified: Secondary | ICD-10-CM | POA: Insufficient documentation

## 2014-10-08 HISTORY — DX: Anxiety disorder, unspecified: F41.9

## 2014-10-08 HISTORY — DX: Constipation, unspecified: K59.00

## 2014-10-08 HISTORY — DX: Other intervertebral disc degeneration, lumbar region without mention of lumbar back pain or lower extremity pain: M51.369

## 2014-10-08 HISTORY — DX: Bipolar disorder, unspecified: F31.9

## 2014-10-08 HISTORY — DX: Other complications of anesthesia, initial encounter: T88.59XA

## 2014-10-08 HISTORY — DX: Other intervertebral disc degeneration, lumbar region: M51.36

## 2014-10-08 HISTORY — DX: Gastro-esophageal reflux disease without esophagitis: K21.9

## 2014-10-08 HISTORY — DX: Adverse effect of unspecified anesthetic, initial encounter: T41.45XA

## 2014-10-08 HISTORY — DX: Anemia, unspecified: D64.9

## 2014-10-08 HISTORY — DX: Personal history of other diseases of the respiratory system: Z87.09

## 2014-10-08 HISTORY — DX: Other specified behavioral and emotional disorders with onset usually occurring in childhood and adolescence: F98.8

## 2014-10-08 HISTORY — DX: Essential (primary) hypertension: I10

## 2014-10-08 LAB — URINALYSIS, ROUTINE W REFLEX MICROSCOPIC
BILIRUBIN URINE: NEGATIVE
Glucose, UA: NEGATIVE mg/dL
Hgb urine dipstick: NEGATIVE
KETONES UR: NEGATIVE mg/dL
Leukocytes, UA: NEGATIVE
NITRITE: NEGATIVE
Protein, ur: NEGATIVE mg/dL
Specific Gravity, Urine: 1.012 (ref 1.005–1.030)
Urobilinogen, UA: 0.2 mg/dL (ref 0.0–1.0)
pH: 7 (ref 5.0–8.0)

## 2014-10-08 LAB — BASIC METABOLIC PANEL
ANION GAP: 8 (ref 5–15)
BUN: 9 mg/dL (ref 6–23)
CALCIUM: 9.1 mg/dL (ref 8.4–10.5)
CO2: 22 mmol/L (ref 19–32)
CREATININE: 0.68 mg/dL (ref 0.50–1.10)
Chloride: 106 mEq/L (ref 96–112)
Glucose, Bld: 81 mg/dL (ref 70–99)
Potassium: 3.8 mmol/L (ref 3.5–5.1)
SODIUM: 136 mmol/L (ref 135–145)

## 2014-10-08 LAB — CBC WITH DIFFERENTIAL/PLATELET
Basophils Absolute: 0 10*3/uL (ref 0.0–0.1)
Basophils Relative: 1 % (ref 0–1)
EOS ABS: 0.1 10*3/uL (ref 0.0–0.7)
Eosinophils Relative: 1 % (ref 0–5)
HCT: 38 % (ref 36.0–46.0)
Hemoglobin: 13.3 g/dL (ref 12.0–15.0)
LYMPHS ABS: 2.3 10*3/uL (ref 0.7–4.0)
Lymphocytes Relative: 36 % (ref 12–46)
MCH: 30.8 pg (ref 26.0–34.0)
MCHC: 35 g/dL (ref 30.0–36.0)
MCV: 88 fL (ref 78.0–100.0)
Monocytes Absolute: 0.4 10*3/uL (ref 0.1–1.0)
Monocytes Relative: 7 % (ref 3–12)
NEUTROS ABS: 3.6 10*3/uL (ref 1.7–7.7)
NEUTROS PCT: 55 % (ref 43–77)
PLATELETS: 327 10*3/uL (ref 150–400)
RBC: 4.32 MIL/uL (ref 3.87–5.11)
RDW: 13.6 % (ref 11.5–15.5)
WBC: 6.3 10*3/uL (ref 4.0–10.5)

## 2014-10-08 LAB — TYPE AND SCREEN
ABO/RH(D): A POS
Antibody Screen: NEGATIVE

## 2014-10-08 LAB — SURGICAL PCR SCREEN
MRSA, PCR: NEGATIVE
STAPHYLOCOCCUS AUREUS: POSITIVE — AB

## 2014-10-08 LAB — HCG, SERUM, QUALITATIVE: Preg, Serum: NEGATIVE

## 2014-10-08 LAB — PROTIME-INR
INR: 0.99 (ref 0.00–1.49)
Prothrombin Time: 13.1 seconds (ref 11.6–15.2)

## 2014-10-08 LAB — APTT: aPTT: 30 seconds (ref 24–37)

## 2014-10-08 LAB — ABO/RH: ABO/RH(D): A POS

## 2014-10-08 NOTE — Progress Notes (Signed)
Mupirocin Ointment [facility] [place] [facility] [place] of [place]. Left message on pt's voicemail notifying her.

## 2014-10-08 NOTE — Pre-Procedure Instructions (Signed)
Tracy Zhang  10/08/2014   Your procedure is scheduled on:  Wednesday, October 16, 2014 at 12:55 PM.   [facility] at 11:00 AM.   Call this number if you have problems the morning of surgery: [phone]   Remember:   Do not eat food or drink liquids after midnight Tuesday, 10/15/14.   Take these medicines the morning of surgery with A SIP OF WATER: Xanax (Alprazolam),  Prilosec (Omeprazole),  Albuterol inhaler - if needed, Hydrocodone - if needed  Stop NSAIDs (Naproxen, Aleve, Ibuprofen, etc.) as of tomorrow.   Do not wear jewelry, make-up or nail polish.  Do not wear lotions, powders, or perfumes. You may wear deodorant.  Do not shave 48 hours prior to surgery.   Do not bring valuables to the hospital.  [facility] is not responsible                  for any belongings or valuables.               Contacts, dentures or bridgework may not be worn into surgery.  Leave suitcase in the car. After surgery it may be brought to your room.  For patients admitted to the hospital, discharge time is determined by your                treatment team.           Special Instructions: [place] - Preparing for Surgery  Before surgery, you can play an important role.  Because skin is not sterile, your skin needs to be as free of germs as possible.  You can reduce the number of germs on you skin by washing with CHG (chlorahexidine gluconate) soap before surgery.  CHG is an antiseptic cleaner which kills germs and bonds with the skin to continue killing germs even after washing.  Please DO NOT use if you have an allergy to CHG or antibacterial soaps.  If your skin becomes reddened/irritated stop using the CHG and inform your nurse when you arrive at Short Stay.  Do not shave (including legs and underarms) for at least 48 hours prior to the first CHG shower.  You may shave your face.  Please follow these instructions carefully:   1.  Shower with CHG  Soap the night before surgery and the                                morning of Surgery.  2.  If you choose to wash your hair, wash your hair first as usual with your       normal shampoo.  3.  After you shampoo, rinse your hair and body thoroughly to remove the                      Shampoo.  4.  Use CHG as you would any other liquid soap.  You can apply chg directly       to the skin and wash gently with scrungie or a clean washcloth.  5.  Apply the CHG Soap to your body ONLY FROM THE NECK DOWN.        Do not use on open wounds or open sores.  Avoid contact with your eyes, ears, mouth and genitals (private parts).  Wash genitals (private parts) with your normal soap.  6.  Wash thoroughly, paying special attention to the  area where your surgery        will be performed.  7.  Thoroughly rinse your body with warm water from the neck down.  8.  DO NOT shower/wash with your normal soap after using and rinsing off       the CHG Soap.  9.  Pat yourself dry with a clean towel.            10.  Wear clean pajamas.            11.  Place clean sheets on your bed the night of your first shower and do not        sleep with pets.  Day of Surgery  Do not apply any lotions the morning of surgery.  Please wear clean clothes to the hospital.     Please read over the following fact sheets that you were given: Pain Booklet, Coughing and Deep Breathing, Blood Transfusion Information, MRSA Information and Surgical Site Infection Prevention

## 2014-10-08 NOTE — Progress Notes (Signed)
Pt denies any recent use of cocaine or marijuana. Pt denies cardiac history, chest pain or sob.

## 2014-10-09 ENCOUNTER — Encounter (HOSPITAL_COMMUNITY): Payer: Self-pay
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
## 2014-10-15 MED ORDER — VANCOMYCIN HCL IN DEXTROSE 1-5 GM/200ML-% IV SOLN
1000.0000 mg | INTRAVENOUS | Status: AC
  Administered 2014-10-16: 1000 mg via INTRAVENOUS
  Filled 2014-10-15: qty 200

## 2014-10-15 MED ORDER — DEXTROSE-NACL 5-0.45 % IV SOLN: INTRAVENOUS | Status: DC
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
## 2014-10-16 ENCOUNTER — Inpatient Hospital Stay (HOSPITAL_COMMUNITY): Payer: Medicare Other | Admitting: Anesthesiology

## 2014-10-16 ENCOUNTER — Inpatient Hospital Stay (HOSPITAL_COMMUNITY): Payer: Medicare Other | Admitting: Emergency Medicine

## 2014-10-16 ENCOUNTER — Encounter (HOSPITAL_COMMUNITY)
Admission: RE | Disposition: A | Discharge status: Home Health | Payer: Self-pay | Source: Ambulatory Visit | Attending: Orthopedic Surgery

## 2014-10-16 ENCOUNTER — Inpatient Hospital Stay (HOSPITAL_COMMUNITY)
Admission: RE | Admit: 2014-10-16 | Discharge: 2014-10-19 | DRG: 470 | Disposition: A | Discharge status: Home Health | Payer: Medicare Other | Source: Ambulatory Visit | Attending: Orthopedic Surgery | Admitting: Orthopedic Surgery

## 2014-10-16 DIAGNOSIS — D62 Acute posthemorrhagic anemia: Secondary | ICD-10-CM | POA: Diagnosis not present

## 2014-10-16 DIAGNOSIS — I10 Essential (primary) hypertension: Secondary | ICD-10-CM | POA: Diagnosis present

## 2014-10-16 DIAGNOSIS — F419 Anxiety disorder, unspecified: Secondary | ICD-10-CM | POA: Diagnosis present

## 2014-10-16 DIAGNOSIS — F319 Bipolar disorder, unspecified: Secondary | ICD-10-CM | POA: Diagnosis present

## 2014-10-16 DIAGNOSIS — M25552 Pain in left hip: Secondary | ICD-10-CM | POA: Diagnosis present

## 2014-10-16 DIAGNOSIS — G894 Chronic pain syndrome: Secondary | ICD-10-CM | POA: Diagnosis present

## 2014-10-16 DIAGNOSIS — M1610 Unilateral primary osteoarthritis, unspecified hip: Secondary | ICD-10-CM | POA: Diagnosis present

## 2014-10-16 DIAGNOSIS — Z881 Allergy status to other antibiotic agents status: Secondary | ICD-10-CM

## 2014-10-16 DIAGNOSIS — M1612 Unilateral primary osteoarthritis, left hip: Principal | ICD-10-CM

## 2014-10-16 DIAGNOSIS — F17210 Nicotine dependence, cigarettes, uncomplicated: Secondary | ICD-10-CM | POA: Diagnosis present

## 2014-10-16 DIAGNOSIS — F988 Other specified behavioral and emotional disorders with onset usually occurring in childhood and adolescence: Secondary | ICD-10-CM | POA: Diagnosis present

## 2014-10-16 DIAGNOSIS — I7300 Raynaud's syndrome without gangrene: Secondary | ICD-10-CM | POA: Diagnosis present

## 2014-10-16 DIAGNOSIS — K219 Gastro-esophageal reflux disease without esophagitis: Secondary | ICD-10-CM | POA: Diagnosis present

## 2014-10-16 DIAGNOSIS — Z79899 Other long term (current) drug therapy: Secondary | ICD-10-CM | POA: Diagnosis not present

## 2014-10-16 DIAGNOSIS — Z8249 Family history of ischemic heart disease and other diseases of the circulatory system: Secondary | ICD-10-CM | POA: Diagnosis not present

## 2014-10-16 DIAGNOSIS — Z7901 Long term (current) use of anticoagulants: Secondary | ICD-10-CM | POA: Diagnosis not present

## 2014-10-16 HISTORY — PX: TOTAL HIP ARTHROPLASTY: SHX124

## 2014-10-16 LAB — CBC
HCT: 38.3 % (ref 36.0–46.0)
Hemoglobin: 12.8 g/dL (ref 12.0–15.0)
MCH: 30.5 pg (ref 26.0–34.0)
MCHC: 33.4 g/dL (ref 30.0–36.0)
MCV: 91.4 fL (ref 78.0–100.0)
Platelets: 305 10*3/uL (ref 150–400)
RBC: 4.19 MIL/uL (ref 3.87–5.11)
RDW: 13.9 % (ref 11.5–15.5)
WBC: 12.4 10*3/uL — AB (ref 4.0–10.5)

## 2014-10-16 LAB — CREATININE, SERUM
Creatinine, Ser: 0.59 mg/dL (ref 0.50–1.10)
GFR calc non Af Amer: >90 mL/min (ref >90)

## 2014-10-16 SURGERY — ARTHROPLASTY, HIP, TOTAL,POSTERIOR APPROACH
Anesthesia: General | Site: Hip | Laterality: Left

## 2014-10-16 MED ORDER — ACETAMINOPHEN 650 MG RE SUPP: 650.0000 mg | Freq: Four times a day (QID) | RECTAL | Status: DC | PRN

## 2014-10-16 MED ORDER — AMLODIPINE BESYLATE 2.5 MG PO TABS: 2.5000 mg | ORAL_TABLET | Freq: Every day | ORAL | Status: DC

## 2014-10-16 MED ORDER — OXYCODONE HCL 5 MG/5ML PO SOLN: 5.0000 mg | Freq: Once | ORAL | Status: DC | PRN

## 2014-10-16 MED ORDER — METOCLOPRAMIDE HCL 5 MG/ML IJ SOLN: 5.0000 mg | Freq: Three times a day (TID) | INTRAMUSCULAR | Status: DC | PRN

## 2014-10-16 MED ORDER — OXYCODONE HCL ER 20 MG PO T12A: 20.0000 mg | EXTENDED_RELEASE_TABLET | Freq: Two times a day (BID) | ORAL | Status: DC

## 2014-10-16 MED ORDER — METOCLOPRAMIDE HCL 10 MG PO TABS
5.0000 mg | ORAL_TABLET | Freq: Three times a day (TID) | ORAL | Status: DC | PRN
Start: 2014-10-16 — End: 2014-10-19

## 2014-10-16 MED ORDER — ACETAMINOPHEN-CAFF-PYRILAMINE 500-60-15 MG PO TABS: 2.0000 | ORAL_TABLET | ORAL | Status: DC | PRN

## 2014-10-16 MED ORDER — FENTANYL CITRATE 0.05 MG/ML IJ SOLN
INTRAMUSCULAR | Status: AC
  Filled 2014-10-16: qty 5

## 2014-10-16 MED ORDER — LIDOCAINE HCL (CARDIAC) 20 MG/ML IV SOLN
INTRAVENOUS | Status: DC | PRN
  Administered 2014-10-16: 100 mg via INTRAVENOUS

## 2014-10-16 MED ORDER — HYDROMORPHONE HCL 1 MG/ML IJ SOLN
INTRAMUSCULAR | Status: AC
  Filled 2014-10-16: qty 1

## 2014-10-16 MED ORDER — OXYCODONE HCL 5 MG PO TABS
5.0000 mg | ORAL_TABLET | ORAL | Status: DC | PRN
  Administered 2014-10-16 – 2014-10-19 (×5): 10 mg via ORAL
  Filled 2014-10-16 (×4): qty 2

## 2014-10-16 MED ORDER — BUPIVACAINE-EPINEPHRINE (PF) 0.5% -1:200000 IJ SOLN
INTRAMUSCULAR | Status: AC
Start: 2014-10-16 — End: 2014-10-16
  Filled 2014-10-16: qty 30

## 2014-10-16 MED ORDER — CARISOPRODOL 350 MG PO TABS
350.0000 mg | ORAL_TABLET | Freq: Every day | ORAL | Status: DC
  Administered 2014-10-16 – 2014-10-18 (×3): 350 mg via ORAL
  Filled 2014-10-16 (×3): qty 1

## 2014-10-16 MED ORDER — DIPHENHYDRAMINE HCL 12.5 MG/5ML PO ELIX: 12.5000 mg | ORAL_SOLUTION | ORAL | Status: DC | PRN

## 2014-10-16 MED ORDER — ROCURONIUM BROMIDE 100 MG/10ML IV SOLN
INTRAVENOUS | Status: DC | PRN
  Administered 2014-10-16: 50 mg via INTRAVENOUS

## 2014-10-16 MED ORDER — ENOXAPARIN SODIUM 40 MG/0.4ML ~~LOC~~ SOLN: 40.0000 mg | SUBCUTANEOUS | Status: DC

## 2014-10-16 MED ORDER — ONDANSETRON HCL 4 MG/2ML IJ SOLN: 4.0000 mg | Freq: Once | INTRAMUSCULAR | Status: DC | PRN

## 2014-10-16 MED ORDER — HYDROMORPHONE HCL 1 MG/ML IJ SOLN
0.2500 mg | INTRAMUSCULAR | Status: DC | PRN
  Administered 2014-10-16 (×4): 0.5 mg via INTRAVENOUS

## 2014-10-16 MED ORDER — DOCUSATE SODIUM 100 MG PO CAPS
100.0000 mg | ORAL_CAPSULE | Freq: Two times a day (BID) | ORAL | Status: DC
  Administered 2014-10-16 – 2014-10-19 (×6): 100 mg via ORAL
  Filled 2014-10-16 (×8): qty 1

## 2014-10-16 MED ORDER — BUPIVACAINE-EPINEPHRINE 0.5% -1:200000 IJ SOLN
INTRAMUSCULAR | Status: DC | PRN
  Administered 2014-10-16: 30 mL

## 2014-10-16 MED ORDER — ENOXAPARIN SODIUM 40 MG/0.4ML ~~LOC~~ SOLN
40.0000 mg | SUBCUTANEOUS | Status: DC
  Administered 2014-10-17 – 2014-10-19 (×3): 40 mg via SUBCUTANEOUS
  Filled 2014-10-16 (×4): qty 0.4

## 2014-10-16 MED ORDER — KCL IN DEXTROSE-NACL 20-5-0.45 MEQ/L-%-% IV SOLN
INTRAVENOUS | Status: DC
  Administered 2014-10-16 – 2014-10-17 (×3): via INTRAVENOUS
  Filled 2014-10-16 (×12): qty 1000

## 2014-10-16 MED ORDER — FLEET ENEMA 7-19 GM/118ML RE ENEM: 1.0000 | ENEMA | Freq: Once | RECTAL | Status: AC | PRN

## 2014-10-16 MED ORDER — ALPRAZOLAM 0.5 MG PO TABS
1.0000 mg | ORAL_TABLET | Freq: Three times a day (TID) | ORAL | Status: DC
  Administered 2014-10-16 – 2014-10-19 (×8): 1 mg via ORAL
  Filled 2014-10-16 (×8): qty 2

## 2014-10-16 MED ORDER — MIDAZOLAM HCL 2 MG/2ML IJ SOLN
INTRAMUSCULAR | Status: DC | PRN
  Administered 2014-10-16: 2 mg via INTRAVENOUS

## 2014-10-16 MED ORDER — TEMAZEPAM 7.5 MG PO CAPS: 7.5000 mg | ORAL_CAPSULE | Freq: Every evening | ORAL | Status: DC | PRN

## 2014-10-16 MED ORDER — LACTATED RINGERS IV SOLN
INTRAVENOUS | Status: DC
  Administered 2014-10-16 (×2): via INTRAVENOUS

## 2014-10-16 MED ORDER — METHOCARBAMOL 500 MG PO TABS
500.0000 mg | ORAL_TABLET | Freq: Four times a day (QID) | ORAL | Status: DC | PRN
  Administered 2014-10-16 – 2014-10-19 (×3): 500 mg via ORAL
  Filled 2014-10-16 (×3): qty 1

## 2014-10-16 MED ORDER — SODIUM CHLORIDE 0.9 % IR SOLN
Status: DC | PRN
  Administered 2014-10-16: 1000 mL

## 2014-10-16 MED ORDER — NEOSTIGMINE METHYLSULFATE 10 MG/10ML IV SOLN
INTRAVENOUS | Status: DC | PRN
  Administered 2014-10-16: 3 mg via INTRAVENOUS

## 2014-10-16 MED ORDER — MIDAZOLAM HCL 2 MG/2ML IJ SOLN
INTRAMUSCULAR | Status: AC
  Filled 2014-10-16: qty 2

## 2014-10-16 MED ORDER — ONDANSETRON HCL 4 MG PO TABS: 4.0000 mg | ORAL_TABLET | Freq: Four times a day (QID) | ORAL | Status: DC | PRN

## 2014-10-16 MED ORDER — CHLORHEXIDINE GLUCONATE 4 % EX LIQD: 60.0000 mL | Freq: Once | CUTANEOUS | Status: DC

## 2014-10-16 MED ORDER — METHOCARBAMOL 1000 MG/10ML IJ SOLN
500.0000 mg | Freq: Four times a day (QID) | INTRAMUSCULAR | Status: DC | PRN
  Filled 2014-10-16: qty 5

## 2014-10-16 MED ORDER — HYDROMORPHONE HCL 1 MG/ML IJ SOLN
1.0000 mg | INTRAMUSCULAR | Status: DC | PRN
  Administered 2014-10-16 – 2014-10-18 (×6): 1 mg via INTRAVENOUS
  Filled 2014-10-16 (×6): qty 1

## 2014-10-16 MED ORDER — LAMOTRIGINE 200 MG PO TABS
400.0000 mg | ORAL_TABLET | Freq: Every day | ORAL | Status: DC
  Administered 2014-10-16 – 2014-10-18 (×3): 400 mg via ORAL
  Filled 2014-10-16 (×4): qty 2

## 2014-10-16 MED ORDER — ALUMINUM HYDROXIDE GEL 320 MG/5ML PO SUSP
15.0000 mL | ORAL | Status: DC | PRN
  Filled 2014-10-16: qty 30

## 2014-10-16 MED ORDER — OXYCODONE HCL 5 MG PO TABS: 5.0000 mg | ORAL_TABLET | Freq: Once | ORAL | Status: DC | PRN

## 2014-10-16 MED ORDER — OXYCODONE-ACETAMINOPHEN 10-325 MG PO TABS: 1.0000 | ORAL_TABLET | Freq: Three times a day (TID) | ORAL | Status: DC | PRN

## 2014-10-16 MED ORDER — OXYCODONE-ACETAMINOPHEN 10-325 MG PO TABS: 1.0000 | ORAL_TABLET | Freq: Four times a day (QID) | ORAL | Status: DC | PRN

## 2014-10-16 MED ORDER — SENNOSIDES-DOCUSATE SODIUM 8.6-50 MG PO TABS: 1.0000 | ORAL_TABLET | Freq: Every evening | ORAL | Status: DC | PRN

## 2014-10-16 MED ORDER — FENTANYL CITRATE 0.05 MG/ML IJ SOLN
INTRAMUSCULAR | Status: DC | PRN
  Administered 2014-10-16: 150 ug via INTRAVENOUS
  Administered 2014-10-16 (×3): 100 ug via INTRAVENOUS
  Administered 2014-10-16: 50 ug via INTRAVENOUS

## 2014-10-16 MED ORDER — LIDOCAINE HCL 4 % MT SOLN
OROMUCOSAL | Status: DC | PRN
  Administered 2014-10-16: 4 mL via TOPICAL

## 2014-10-16 MED ORDER — ACETAMINOPHEN 325 MG PO TABS: 650.0000 mg | ORAL_TABLET | Freq: Four times a day (QID) | ORAL | Status: DC | PRN

## 2014-10-16 MED ORDER — ALBUTEROL SULFATE (2.5 MG/3ML) 0.083% IN NEBU: 2.5000 mg | INHALATION_SOLUTION | Freq: Four times a day (QID) | RESPIRATORY_TRACT | Status: DC | PRN

## 2014-10-16 MED ORDER — GLYCOPYRROLATE 0.2 MG/ML IJ SOLN
INTRAMUSCULAR | Status: DC | PRN
  Administered 2014-10-16: 0.4 mg via INTRAVENOUS

## 2014-10-16 MED ORDER — OXYCODONE HCL 5 MG PO TABS
ORAL_TABLET | ORAL | Status: AC
  Administered 2014-10-16: 10 mg via ORAL
  Filled 2014-10-16: qty 2

## 2014-10-16 MED ORDER — TRANEXAMIC ACID 100 MG/ML IV SOLN
2000.0000 mg | Freq: Once | INTRAVENOUS | Status: DC
  Filled 2014-10-16: qty 20

## 2014-10-16 MED ORDER — AMPHETAMINE-DEXTROAMPHETAMINE 10 MG PO TABS
20.0000 mg | ORAL_TABLET | Freq: Two times a day (BID) | ORAL | Status: DC
  Administered 2014-10-16 – 2014-10-19 (×6): 20 mg via ORAL
  Filled 2014-10-16 (×6): qty 2

## 2014-10-16 MED ORDER — VALACYCLOVIR HCL 500 MG PO TABS
500.0000 mg | ORAL_TABLET | Freq: Every day | ORAL | Status: DC | PRN
  Filled 2014-10-16: qty 1

## 2014-10-16 MED ORDER — BISACODYL 5 MG PO TBEC: 5.0000 mg | DELAYED_RELEASE_TABLET | Freq: Every day | ORAL | Status: DC | PRN

## 2014-10-16 MED ORDER — ONDANSETRON HCL 4 MG/2ML IJ SOLN: 4.0000 mg | Freq: Four times a day (QID) | INTRAMUSCULAR | Status: DC | PRN

## 2014-10-16 MED ORDER — PANTOPRAZOLE SODIUM 40 MG PO TBEC
40.0000 mg | DELAYED_RELEASE_TABLET | Freq: Every day | ORAL | Status: DC
  Administered 2014-10-17 – 2014-10-19 (×3): 40 mg via ORAL
  Filled 2014-10-16 (×3): qty 1

## 2014-10-16 MED ORDER — ONDANSETRON HCL 4 MG/2ML IJ SOLN
INTRAMUSCULAR | Status: DC | PRN
  Administered 2014-10-16: 4 mg via INTRAVENOUS

## 2014-10-16 MED ORDER — PROPOFOL 10 MG/ML IV BOLUS
INTRAVENOUS | Status: AC
  Filled 2014-10-16: qty 20

## 2014-10-16 MED ORDER — MENTHOL 3 MG MT LOZG: 1.0000 | LOZENGE | OROMUCOSAL | Status: DC | PRN

## 2014-10-16 MED ORDER — PHENOL 1.4 % MT LIQD: 1.0000 | OROMUCOSAL | Status: DC | PRN

## 2014-10-16 MED ORDER — FLUTICASONE PROPIONATE 50 MCG/ACT NA SUSP
2.0000 | Freq: Every day | NASAL | Status: DC | PRN
  Filled 2014-10-16: qty 16

## 2014-10-16 MED ORDER — CLOTRIMAZOLE 1 % EX CREA
TOPICAL_CREAM | Freq: Two times a day (BID) | CUTANEOUS | Status: DC | PRN
  Filled 2014-10-16: qty 15

## 2014-10-16 SURGICAL SUPPLY — 54 items
BLADE SAW SGTL 18X1.27X75 (BLADE) ×2 IMPLANT
BRUSH FEMORAL CANAL (MISCELLANEOUS) IMPLANT
CAPT HIP TOTAL 2 ×2 IMPLANT
COVER BACK TABLE 24X17X13 BIG (DRAPES) IMPLANT
COVER SURGICAL LIGHT HANDLE (MISCELLANEOUS) ×4 IMPLANT
DRAPE IMP U-DRAPE 54X76 (DRAPES) ×2 IMPLANT
DRAPE ORTHO SPLIT 77X108 STRL (DRAPES) ×1
DRAPE PROXIMA HALF (DRAPES) ×2 IMPLANT
DRAPE SURG ORHT 6 SPLT 77X108 (DRAPES) ×1 IMPLANT
DRAPE U-SHAPE 47X51 STRL (DRAPES) ×2 IMPLANT
DRILL BIT 7/64X5 (BIT) ×2 IMPLANT
DRSG AQUACEL AG ADV 3.5X10 (GAUZE/BANDAGES/DRESSINGS) ×2 IMPLANT
DURAPREP 26ML APPLICATOR (WOUND CARE) ×2 IMPLANT
ELECT BLADE 4.0 EZ CLEAN MEGAD (MISCELLANEOUS)
ELECT REM PT RETURN 9FT ADLT (ELECTROSURGICAL) ×2
ELECTRODE BLDE 4.0 EZ CLN MEGD (MISCELLANEOUS) IMPLANT
ELECTRODE REM PT RTRN 9FT ADLT (ELECTROSURGICAL) ×1 IMPLANT
GLOVE BIO SURGEON STRL SZ7.5 (GLOVE) ×2 IMPLANT
GLOVE BIO SURGEON STRL SZ8.5 (GLOVE) ×4 IMPLANT
GLOVE BIOGEL PI IND STRL 8 (GLOVE) ×2 IMPLANT
GLOVE BIOGEL PI IND STRL 9 (GLOVE) ×1 IMPLANT
GLOVE BIOGEL PI INDICATOR 8 (GLOVE) ×2
GLOVE BIOGEL PI INDICATOR 9 (GLOVE) ×1
GLOVE SURG SS PI 7.0 STRL IVOR (GLOVE) ×6 IMPLANT
GOWN STRL REUS W/ TWL LRG LVL3 (GOWN DISPOSABLE) ×2 IMPLANT
GOWN STRL REUS W/ TWL XL LVL3 (GOWN DISPOSABLE) ×2 IMPLANT
GOWN STRL REUS W/TWL LRG LVL3 (GOWN DISPOSABLE) ×2
GOWN STRL REUS W/TWL XL LVL3 (GOWN DISPOSABLE) ×2
HANDPIECE INTERPULSE COAX TIP (DISPOSABLE)
HOOD PEEL AWAY FACE SHEILD DIS (HOOD) ×4 IMPLANT
KIT BASIN OR (CUSTOM PROCEDURE TRAY) ×2 IMPLANT
KIT ROOM TURNOVER OR (KITS) ×2 IMPLANT
MANIFOLD NEPTUNE II (INSTRUMENTS) ×2 IMPLANT
NEEDLE 22X1 1/2 (OR ONLY) (NEEDLE) ×2 IMPLANT
NS IRRIG 1000ML POUR BTL (IV SOLUTION) ×2 IMPLANT
PACK TOTAL JOINT (CUSTOM PROCEDURE TRAY) ×2 IMPLANT
PACK UNIVERSAL I (CUSTOM PROCEDURE TRAY) ×2 IMPLANT
PAD ARMBOARD 7.5X6 YLW CONV (MISCELLANEOUS) ×4 IMPLANT
PASSER SUT SWANSON 36MM LOOP (INSTRUMENTS) ×2 IMPLANT
PRESSURIZER FEMORAL UNIV (MISCELLANEOUS) IMPLANT
SET HNDPC FAN SPRY TIP SCT (DISPOSABLE) IMPLANT
SUT ETHIBOND 2 V 37 (SUTURE) ×2 IMPLANT
SUT VIC AB 0 CTB1 27 (SUTURE) ×2 IMPLANT
SUT VIC AB 1 CTX 36 (SUTURE) ×1
SUT VIC AB 1 CTX36XBRD ANBCTR (SUTURE) ×1 IMPLANT
SUT VIC AB 2-0 CTB1 (SUTURE) ×4 IMPLANT
SUT VIC AB 3-0 SH 27 (SUTURE) ×1
SUT VIC AB 3-0 SH 27X BRD (SUTURE) ×1 IMPLANT
SYR CONTROL 10ML LL (SYRINGE) ×2 IMPLANT
TOWEL OR 17X24 6PK STRL BLUE (TOWEL DISPOSABLE) ×2 IMPLANT
TOWEL OR 17X26 10 PK STRL BLUE (TOWEL DISPOSABLE) ×2 IMPLANT
TOWER CARTRIDGE SMART MIX (DISPOSABLE) IMPLANT
TRAY FOLEY CATH 14FR (SET/KITS/TRAYS/PACK) IMPLANT
WATER STERILE IRR 1000ML POUR (IV SOLUTION) ×8 IMPLANT

## 2014-10-16 NOTE — Anesthesia Preprocedure Evaluation (Addendum)
Anesthesia Evaluation  Patient identified by MRN, date of birth, ID band Patient awake    Reviewed: Allergy & Precautions, NPO status , Patient's Chart, lab work & pertinent test results  Airway Mallampati: I  TM Distance: >3 FB Neck ROM: Full    Dental  (+) Teeth Intact, Dental Advisory Given   Pulmonary Current Smoker,  breath sounds clear to auscultation        Cardiovascular hypertension, Pt. on medications Rhythm:Regular Rate:Normal     Neuro/Psych Anxiety Depression Bipolar Disorder    GI/Hepatic GERD-  Medicated and Controlled,  Endo/Other    Renal/GU      Musculoskeletal   Abdominal   Peds  Hematology   Anesthesia Other Findings   Reproductive/Obstetrics                            Anesthesia Physical Anesthesia Plan  ASA: II  Anesthesia Plan: General   Post-op Pain Management:    Induction: Intravenous  Airway Management Planned: LMA  Additional Equipment:   Intra-op Plan:   Post-operative Plan: Extubation in OR  Informed Consent: I have reviewed the patients History and Physical, chart, labs and discussed the procedure including the risks, benefits and alternatives for the proposed anesthesia with the patient or authorized representative who has indicated his/her understanding and acceptance.   Dental advisory given  Plan Discussed with: CRNA and Surgeon  Anesthesia Plan Comments:        Anesthesia Quick Evaluation

## 2014-10-16 NOTE — Op Note (Signed)
OPERATIVE REPORT    DATE OF PROCEDURE:  10/16/2014       PREOPERATIVE DIAGNOSIS:   LEFT HIP OSTEOARTHRITIS                                                          POSTOPERATIVE DIAGNOSIS:  LEFT HIP OSTEOARTHRITIS                                                           PROCEDURE:  L total hip arthroplasty using a 52 mm DePuy Pinnacle  Cup, [employer] Hole Eliminator, 10-degree polyethylene liner index superior  and posterior, a +0 36 mm ceramic head, a 18x13x150x42 SROM stem, 18Dsm Sleeve   SURGEON: Alveda Vanhorne J    ASSISTANT:   Eric K. [employer] PA-C  (present throughout entire procedure and necessary for timely completion of the procedure)   ANESTHESIA: General BLOOD LOSS: 400 FLUID REPLACEMENT: 1800 crystalloid DRAINS: Foley Catheter URINE OUTPUT: 300cc COMPLICATIONS: none    INDICATIONS FOR PROCEDURE: A 44 y.o. year-old With   LEFT HIP OSTEOARTHRITIS   for 2 years, x-rays show bone-on-bone arthritic changes. Despite conservative measures with observation, anti-inflammatory medicine, narcotics, use of a cane, has severe unremitting pain and can ambulate only a few blocks before resting.  Patient desires elective L total hip arthroplasty to decrease pain and increase function. The risks, benefits, and alternatives were discussed at length including but not limited to the risks of infection, bleeding, nerve injury, stiffness, blood clots, the need for revision surgery, cardiopulmonary complications, among others, and they were willing to proceed. Questions answered     PROCEDURE IN DETAIL: The patient was identified by armband,  [facility], taken to the operating room , appropriate anesthetic monitors  were attached and general endotracheal anesthesia induced. Foley catheter was inserted. Pt was rolled into the R lateral decubitus position and fixed there with a Stulberg Mark II pelvic clamp.  The L lower extremity was  then prepped and draped  in the usual sterile fashion from the ankle to the hemipelvis. A time-out  procedure was performed. The skin along the lateral hip and thigh  infiltrated with 10 mL of 0.5% Marcaine and epinephrine solution. We  then made a posterolateral approach to the hip. With a #10 blade, a 10 cm  incision was made through the skin and subcutaneous tissue down to the level of the  IT band. Small bleeders were identified and cauterized. The IT band was cut in  line with skin incision exposing the greater trochanter. A Cobra retractor was placed between the gluteus minimus and the superior hip joint capsule, and a spiked Cobra between the quadratus femoris and the inferior hip joint capsule. This isolated the short  external rotators and piriformis tendons. These were tagged with a #2 Ethibond  suture and cut off their insertion on the intertrochanteric crest. The posterior  capsule was then developed into an acetabular-based flap from Posterior Superior off of the acetabulum out over the femoral neck and back posterior inferior to the acetabular rim. This flap was tagged with two #  2 Ethibond sutures and retracted protecting the sciatic nerve. This exposed the arthritic femoral head and osteophytes. The hip was then flexed and internally rotated, dislocating the femoral head and a standard neck cut performed 1 fingerbreadth above the lesser trochanter.  A spiked Cobra was placed in the cotyloid notch and a Hohmann retractor was then used to lever the femur anteriorly off of the anterior pelvic column. A posterior-inferior wing retractor was placed at the junction of the acetabulum and the ischium completing the acetabular exposure.We then removed the peripheral osteophytes and labrum from the acetabulum. We then reamed the acetabulum up to 52 mm with basket reamers obtaining good coverage in all quadrants. We then irrigated with normal  saline solution and hammered into place a 52 mm pinnacle  cup in 45  degrees of abduction and about 20 degrees of anteversion. More  peripheral osteophytes removed and a trial 10-degree liner placed with the  index superior-posterior. The hip was then flexed and internally rotated exposing the  proximal femur, which was entered with the initiating reamer followed by  the axial reamers up to a 13.5 mm full depth and 14mm partial depth. We then conically reamed to 18D to the correct depth for a 42 base neck. The calcar was milled to 18Dsm. A trial cone and stem was inserted in the 25 degrees anteversion, with a +0 36mm trial head. Trial reduction was then performed and excellent stability was noted with at 90 of flexion with 70 of internal rotation and then full extension with maximal external rotation. The hip could not be dislocated in full extension. The knee could easily flex  to about 130 degrees. We also stretched the abductors at this point,  because of the preexisting adductor contractures. All trial components  were then removed. The acetabulum was irrigated out with normal saline  solution. A titanium [facility]pex [facility] was then screwed into place  followed by a 10-degree polyethylene liner index superior-posterior. On  the femoral side a 18Dsm ZTT1 sleeve was hammered into place, followed by a 18x13x150x42 SROM stem in 25 degrees of anteversion. At this point, a +0 36 mm ceramic head was  hammered on the stem. The hip was reduced. We checked our stability  one more time and found it to be excellent. The wound was once again  thoroughly irrigated out with normal saline solution pulse lavage. The  capsular flap and short external rotators were repaired back to the  intertrochanteric crest through drill holes with a #2 Ethibond suture.  The IT band was closed with running 1 Vicryl suture. The subcutaneous  tissue with 0 and 2-0 undyed Vicryl suture and the skin with running  interlocking 3-0 nylon suture. Dressing of Xeroform and Mepilex was   then applied. The patient was then unclamped, rolled supine, awaken extubated and taken to recovery room without difficulty in stable condition.   Gean Birchwood J 10/16/2014, 2:35 PM

## 2014-10-16 NOTE — Transfer of Care (Signed)
Immediate Anesthesia Transfer of Care Note  Patient: Tracy GipLinda Zhang  Procedure(s) Performed: Procedure(s): TOTAL HIP ARTHROPLASTY (Left)  Patient Location: PACU  Anesthesia Type:General  Level of Consciousness: sedated  Airway & Oxygen Therapy: Patient Spontanous Breathing and Patient connected to nasal cannula oxygen  Post-op Assessment: Report given to PACU RN, Post -op Vital signs reviewed and stable and Patient moving all extremities X 4  Post vital signs: Reviewed and stable  Complications: No apparent anesthesia complications

## 2014-10-16 NOTE — Interval H&P Note (Signed)
History and Physical Interval Note:  10/16/2014 1:15 PM  Tracy Zhang  has presented today for surgery, with the diagnosis of LEFT HIP OSTEOARTHRITIS  The various methods of treatment have been discussed with the patient and family. After consideration of risks, benefits and other options for treatment, the patient has consented to  Procedure(s): TOTAL HIP ARTHROPLASTY (Left) as a surgical intervention .  The patient's history has been reviewed, patient examined, no change in status, stable for surgery.  I have reviewed the patient's chart and labs.  Questions were answered to the patient's satisfaction.     Nestor LewandowskyOWAN,Lear Carstens J

## 2014-10-16 NOTE — Anesthesia Postprocedure Evaluation (Signed)
Anesthesia Post Note  Patient: Myles GipLinda Korb  Procedure(s) Performed: Procedure(s) (LRB): TOTAL HIP ARTHROPLASTY (Left)  Anesthesia type: general  Patient location: PACU  Post pain: Pain level controlled  Post assessment: Patient's Cardiovascular Status Stable  Last Vitals:  Filed Vitals:   10/16/14 1615  BP:   Pulse: 89  Temp:   Resp: 13    Post vital signs: Reviewed and stable  Level of consciousness: sedated  Complications: No apparent anesthesia complications

## 2014-10-16 NOTE — Plan of Care (Signed)
Problem: Consults Goal: Diagnosis- Total Joint Replacement Primary Total Hip Left     

## 2014-10-16 NOTE — Progress Notes (Signed)
Utilization review completed.  

## 2014-10-17 ENCOUNTER — Encounter (HOSPITAL_COMMUNITY): Payer: Self-pay | Admitting: Orthopedic Surgery

## 2014-10-17 LAB — BASIC METABOLIC PANEL
Anion gap: 7 (ref 5–15)
BUN: 6 mg/dL (ref 6–23)
CO2: 27 mmol/L (ref 19–32)
CREATININE: 0.57 mg/dL (ref 0.50–1.10)
Calcium: 8.3 mg/dL — ABNORMAL LOW (ref 8.4–10.5)
Chloride: 101 mEq/L (ref 96–112)
GFR calc Af Amer: >90 mL/min (ref >90)
Glucose, Bld: 127 mg/dL — ABNORMAL HIGH (ref 70–99)
Potassium: 4.1 mmol/L (ref 3.5–5.1)
Sodium: 135 mmol/L (ref 135–145)

## 2014-10-17 LAB — CBC
HCT: 34.6 % — ABNORMAL LOW (ref 36.0–46.0)
Hemoglobin: 11.2 g/dL — ABNORMAL LOW (ref 12.0–15.0)
MCH: 29.2 pg (ref 26.0–34.0)
MCHC: 32.4 g/dL (ref 30.0–36.0)
MCV: 90.3 fL (ref 78.0–100.0)
Platelets: 276 10*3/uL (ref 150–400)
RBC: 3.83 MIL/uL — ABNORMAL LOW (ref 3.87–5.11)
RDW: 13.9 % (ref 11.5–15.5)
WBC: 8.4 10*3/uL (ref 4.0–10.5)
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
## 2014-10-17 NOTE — Progress Notes (Signed)
Patient ID: Tracy Zhang, female   DOB: 07/16/1971, [age] y.o.   MRN: [id number] PATIENT ID: Tracy GipLinda Zhang  MRN: [id number]  DOB/AGE:  05/05/1971 / 44 y.o.  1 Day Post-Op Procedure(s) (LRB): TOTAL HIP ARTHROPLASTY (Left)    PROGRESS NOTE Subjective: Patient is alert, oriented, no Nausea, no Vomiting, yes passing gas, no Bowel Movement. Taking PO well. Denies SOB, Chest or Calf Pain. Using Incentive Spirometer, PAS in place. Ambulate WBAT today Patient reports pain as 8 on 0-10 scale  .    Objective: Vital signs in last 24 hours: Filed Vitals:   10/16/14 1725 10/16/14 1745 10/17/14 0124 10/17/14 0519  BP: 118/72 137/84 130/80 120/70  Pulse: 97 90 96 111  Temp: 97.7 F (36.5 C) 98.6 F (37 C) 98.2 F (36.8 C) 98.5 F (36.9 C)  TempSrc:  Oral Oral Oral  Resp: 13 16    Height:      Weight:      SpO2: 100% 100% 97% 96%      Intake/Output from previous day: I/O last 3 completed shifts: In: 2665 [P.O.:240; I.V.:2425] Out: 520 [Urine:520]   Intake/Output this shift:     LABORATORY DATA:  Recent Labs  10/16/14 1855 10/17/14 0606  WBC 12.4* 8.4  HGB 12.8 11.2*  HCT 38.3 34.6*  PLT 305 276  NA  --  135  K  --  4.1  CL  --  101  CO2  --  27  BUN  --  6  CREATININE 0.59 0.57  GLUCOSE  --  127*  CALCIUM  --  8.3*    Examination: Neurologically intact ABD soft Neurovascular intact Sensation intact distally Intact pulses distally Dorsiflexion/Plantar flexion intact Incision: dressing C/D/I No cellulitis present Compartment soft}   Assessment:   1 Day Post-Op Procedure(s) (LRB): TOTAL HIP ARTHROPLASTY (Left) ADDITIONAL DIAGNOSIS:  Expected Acute Blood Loss Anemia, chronic pain management, depression, ADD.  Plan: PT/OT WBAT, THA  posterior precautions  DVT Prophylaxis: SCDx72 hrs, ASA 325 mg BID x 2 weeks  DISCHARGE PLAN: Home  DISCHARGE NEEDS: HHPT, Walker and 3-in-1 comode seat

## 2014-10-17 NOTE — Progress Notes (Signed)
10[facility]17/14 [facility].Spoke with patient,no change in d/c plan. Patient stated that she does not have a rolling walker or 3N1. Contacted Frank with [employer] and requested rolling walker and 3N1 be delivered to patient's room. Patient will have family available to assist after d/c. Will continue to follow.

## 2014-10-17 NOTE — Progress Notes (Signed)
Pt unable to void this am. Bladder scan done and noted 592 cc of unire. . Order for x 1 I/O  done and  520 cc of  tea colored urine noted. Pt tolerated procedure well and verbalized relief . Po intake encouraged. RN will continue to monitor pt.

## 2014-10-18 LAB — CBC
HEMATOCRIT: 33.8 % — AB (ref 36.0–46.0)
HEMOGLOBIN: 10.9 g/dL — AB (ref 12.0–15.0)
MCH: 29.1 pg (ref 26.0–34.0)
MCHC: 32.2 g/dL (ref 30.0–36.0)
MCV: 90.4 fL (ref 78.0–100.0)
Platelets: 271 10*3/uL (ref 150–400)
RBC: 3.74 MIL/uL — AB (ref 3.87–5.11)
RDW: 14.1 % (ref 11.5–15.5)
WBC: 8.7 10*3/uL (ref 4.0–10.5)
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
## 2014-10-18 NOTE — Progress Notes (Signed)
PATIENT ID: Tracy GipLinda Zhang  MRN: [id number]  DOB/AGE:  01/05/1971 / 44 y.o.  2 Days Post-Op Procedure(s) (LRB): TOTAL HIP ARTHROPLASTY (Left)    PROGRESS NOTE Subjective: Patient is alert, oriented, no Nausea, no Vomiting, yes passing gas, no Bowel Movement. Taking PO small bites and sips. Denies SOB, Chest or Calf Pain. Using Incentive Spirometer, PAS in place. Ambulate WBAT with pt progressing slowly wit therapy Patient reports pain as 3 on 0-10 scale and 4 on 0-10 scale  .    Objective: Vital signs in last 24 hours: Filed Vitals:   10/17/14 2326 10/18/14 0000 10/18/14 0400 10/18/14 0544  BP: 120/67   130/71  Pulse: 117   117  Temp: 99.2 F (37.3 C)   99.4 F (37.4 C)  TempSrc: Oral   Oral  Resp: 17 16 16 19   Height:      Weight:      SpO2: 100%   100%      Intake/Output from previous day: I/O last 3 completed shifts: In: 1600 [P.O.:600; I.V.:1000] Out: 720 [Urine:720]   Intake/Output this shift:     LABORATORY DATA:  Recent Labs  10/16/14 1855 10/17/14 0606 10/18/14 0431  WBC 12.4* 8.4 8.7  HGB 12.8 11.2* 10.9*  HCT 38.3 34.6* 33.8*  PLT 305 276 271  NA  --  135  --   K  --  4.1  --   CL  --  101  --   CO2  --  27  --   BUN  --  6  --   CREATININE 0.59 0.57  --   GLUCOSE  --  127*  --   CALCIUM  --  8.3*  --     Examination: Neurologically intact Neurovascular intact Sensation intact distally Intact pulses distally Dorsiflexion/Plantar flexion intact Incision: dressing C/D/I No cellulitis present Compartment soft} XR AP&Lat of hip shows well placed\fixed THA  Assessment:   2 Days Post-Op Procedure(s) (LRB): TOTAL HIP ARTHROPLASTY (Left) ADDITIONAL DIAGNOSIS:  Expected Acute Blood Loss Anemia, chronic pain management, depression, ADD.  Plan: PT/OT WBAT, THA  posterior precautions  DVT Prophylaxis: SCDx72 hrs, ASA 325 mg BID x 2 weeks  DISCHARGE PLAN: Home, when pt passes therapy goals.  DISCHARGE NEEDS: HHPT, HHRN, Walker and 3-in-1 comode  seat

## 2014-10-19 LAB — CBC
HEMATOCRIT: 33.3 % — AB (ref 36.0–46.0)
Hemoglobin: 10.6 g/dL — ABNORMAL LOW (ref 12.0–15.0)
MCH: 28.7 pg (ref 26.0–34.0)
MCHC: 31.8 g/dL (ref 30.0–36.0)
MCV: 90.2 fL (ref 78.0–100.0)
Platelets: 262 10*3/uL (ref 150–400)
RBC: 3.69 MIL/uL — ABNORMAL LOW (ref 3.87–5.11)
RDW: 14.1 % (ref 11.5–15.5)
WBC: 7.4 10*3/uL (ref 4.0–10.5)

## 2014-10-19 NOTE — Progress Notes (Signed)
   PATIENT ID: Tracy GipLinda Zhang   3 Days Post-Op Procedure(s) (LRB): TOTAL HIP ARTHROPLASTY (Left)  Subjective: Comfortable, ready to go home.  Objective:  Filed Vitals:   10/19/14 0515  BP: 113/65  Pulse: 108  Temp: 99 F (37.2 C)  Resp: 17     Left hip dressing clean dry and intact. Lower extremities neurovascularly intact.  Labs:   Recent Labs  10/16/14 1855 10/17/14 0606 10/18/14 0431 10/19/14 0458  HGB 12.8 11.2* 10.9* 10.6*   Recent Labs  10/18/14 0431 10/19/14 0458  WBC 8.7 7.4  RBC 3.74* 3.69*  HCT 33.8* 33.3*  PLT 271 262   Recent Labs  10/16/14 1855 10/17/14 0606  NA  --  135  K  --  4.1  CL  --  101  CO2  --  27  BUN  --  6  CREATININE 0.59 0.57  GLUCOSE  --  127*  CALCIUM  --  8.3*    Assessment and Plan: Doing well after left total hip replacement Plan DC home today with family. Follow-up with Dr. Turner Danielsowan as an outpatient.
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
## 2014-11-06 ENCOUNTER — Encounter (HOSPITAL_COMMUNITY): Payer: Self-pay | Admitting: Emergency Medicine

## 2014-11-06 ENCOUNTER — Other Ambulatory Visit (HOSPITAL_COMMUNITY): Payer: Self-pay | Admitting: Orthopedic Surgery

## 2014-11-06 ENCOUNTER — Ambulatory Visit (HOSPITAL_COMMUNITY)
Admission: RE | Admit: 2014-11-06 | Discharge: 2014-11-06 | Disposition: A | Discharge status: Home / Self Care | Payer: Medicare Other | Source: Ambulatory Visit | Attending: Orthopedic Surgery | Admitting: Orthopedic Surgery

## 2014-11-06 ENCOUNTER — Observation Stay (HOSPITAL_COMMUNITY)
Admission: EM | Admit: 2014-11-06 | Discharge: 2014-11-07 | Disposition: A | Discharge status: Home / Self Care | Payer: Medicare Other | Attending: Internal Medicine | Admitting: Internal Medicine

## 2014-11-06 ENCOUNTER — Emergency Department (HOSPITAL_COMMUNITY): Payer: Medicare Other

## 2014-11-06 DIAGNOSIS — F319 Bipolar disorder, unspecified: Secondary | ICD-10-CM | POA: Insufficient documentation

## 2014-11-06 DIAGNOSIS — M1612 Unilateral primary osteoarthritis, left hip: Secondary | ICD-10-CM | POA: Insufficient documentation

## 2014-11-06 DIAGNOSIS — Z96642 Presence of left artificial hip joint: Secondary | ICD-10-CM | POA: Insufficient documentation

## 2014-11-06 DIAGNOSIS — M25562 Pain in left knee: Secondary | ICD-10-CM

## 2014-11-06 DIAGNOSIS — F17210 Nicotine dependence, cigarettes, uncomplicated: Secondary | ICD-10-CM | POA: Diagnosis not present

## 2014-11-06 DIAGNOSIS — I7300 Raynaud's syndrome without gangrene: Secondary | ICD-10-CM | POA: Diagnosis not present

## 2014-11-06 DIAGNOSIS — G894 Chronic pain syndrome: Secondary | ICD-10-CM | POA: Diagnosis not present

## 2014-11-06 DIAGNOSIS — K219 Gastro-esophageal reflux disease without esophagitis: Secondary | ICD-10-CM | POA: Diagnosis not present

## 2014-11-06 DIAGNOSIS — M479 Spondylosis, unspecified: Secondary | ICD-10-CM | POA: Diagnosis not present

## 2014-11-06 DIAGNOSIS — Z881 Allergy status to other antibiotic agents status: Secondary | ICD-10-CM | POA: Diagnosis not present

## 2014-11-06 DIAGNOSIS — I82409 Acute embolism and thrombosis of unspecified deep veins of unspecified lower extremity: Secondary | ICD-10-CM | POA: Diagnosis present

## 2014-11-06 DIAGNOSIS — I2699 Other pulmonary embolism without acute cor pulmonale: Principal | ICD-10-CM | POA: Insufficient documentation

## 2014-11-06 DIAGNOSIS — I82402 Acute embolism and thrombosis of unspecified deep veins of left lower extremity: Secondary | ICD-10-CM | POA: Insufficient documentation

## 2014-11-06 DIAGNOSIS — I10 Essential (primary) hypertension: Secondary | ICD-10-CM | POA: Diagnosis not present

## 2014-11-06 DIAGNOSIS — Z79899 Other long term (current) drug therapy: Secondary | ICD-10-CM | POA: Diagnosis not present

## 2014-11-06 DIAGNOSIS — F988 Other specified behavioral and emotional disorders with onset usually occurring in childhood and adolescence: Secondary | ICD-10-CM | POA: Diagnosis not present

## 2014-11-06 DIAGNOSIS — F329 Major depressive disorder, single episode, unspecified: Secondary | ICD-10-CM | POA: Diagnosis not present

## 2014-11-06 DIAGNOSIS — M79662 Pain in left lower leg: Secondary | ICD-10-CM

## 2014-11-06 DIAGNOSIS — R000 Tachycardia, unspecified: Secondary | ICD-10-CM | POA: Insufficient documentation

## 2014-11-06 DIAGNOSIS — I824Z2 Acute embolism and thrombosis of unspecified deep veins of left distal lower extremity: Secondary | ICD-10-CM | POA: Insufficient documentation

## 2014-11-06 LAB — CBC WITH DIFFERENTIAL/PLATELET
BASOS ABS: 0 10*3/uL (ref 0.0–0.1)
Basophils Relative: 1 % (ref 0–1)
EOS PCT: 1 % (ref 0–5)
Eosinophils Absolute: 0.1 10*3/uL (ref 0.0–0.7)
HEMATOCRIT: 34.9 % — AB (ref 36.0–46.0)
Hemoglobin: 11.4 g/dL — ABNORMAL LOW (ref 12.0–15.0)
Lymphocytes Relative: 30 % (ref 12–46)
Lymphs Abs: 2 10*3/uL (ref 0.7–4.0)
MCH: 28.6 pg (ref 26.0–34.0)
MCHC: 32.7 g/dL (ref 30.0–36.0)
MCV: 87.7 fL (ref 78.0–100.0)
MONO ABS: 0.5 10*3/uL (ref 0.1–1.0)
Monocytes Relative: 7 % (ref 3–12)
Neutro Abs: 4 10*3/uL (ref 1.7–7.7)
Neutrophils Relative %: 61 % (ref 43–77)
PLATELETS: 430 10*3/uL — AB (ref 150–400)
RBC: 3.98 MIL/uL (ref 3.87–5.11)
RDW: 13.6 % (ref 11.5–15.5)
WBC: 6.6 10*3/uL (ref 4.0–10.5)

## 2014-11-06 LAB — BASIC METABOLIC PANEL
ANION GAP: 11 (ref 5–15)
BUN: 12 mg/dL (ref 6–23)
CHLORIDE: 102 mmol/L (ref 96–112)
CO2: 23 mmol/L (ref 19–32)
Calcium: 9 mg/dL (ref 8.4–10.5)
Creatinine, Ser: 0.67 mg/dL (ref 0.50–1.10)
GFR calc non Af Amer: >90 mL/min (ref >90)
Glucose, Bld: 84 mg/dL (ref 70–99)
POTASSIUM: 4.2 mmol/L (ref 3.5–5.1)
Sodium: 136 mmol/L (ref 135–145)

## 2014-11-06 LAB — APTT: APTT: 32 s (ref 24–37)

## 2014-11-06 LAB — PROTIME-INR
INR: 0.99 (ref 0.00–1.49)
Prothrombin Time: 13.2 seconds (ref 11.6–15.2)

## 2014-11-06 MED ORDER — AMPHETAMINE-DEXTROAMPHETAMINE 20 MG PO TABS: 20.0000 mg | ORAL_TABLET | Freq: Two times a day (BID) | ORAL | Status: DC

## 2014-11-06 MED ORDER — OXYCODONE-ACETAMINOPHEN 10-325 MG PO TABS: 1.0000 | ORAL_TABLET | Freq: Three times a day (TID) | ORAL | Status: DC | PRN

## 2014-11-06 MED ORDER — GUAIFENESIN-DM 100-10 MG/5ML PO SYRP: 5.0000 mL | ORAL_SOLUTION | ORAL | Status: DC | PRN

## 2014-11-06 MED ORDER — OXYCODONE HCL 5 MG PO TABS: 5.0000 mg | ORAL_TABLET | Freq: Three times a day (TID) | ORAL | Status: DC | PRN

## 2014-11-06 MED ORDER — ACETAMINOPHEN-CAFF-PYRILAMINE 500-60-15 MG PO TABS: 2.0000 | ORAL_TABLET | ORAL | Status: DC | PRN

## 2014-11-06 MED ORDER — OXYCODONE HCL ER 20 MG PO T12A
20.0000 mg | EXTENDED_RELEASE_TABLET | Freq: Two times a day (BID) | ORAL | Status: DC
  Administered 2014-11-06 – 2014-11-07 (×2): 20 mg via ORAL
  Filled 2014-11-06 (×2): qty 2

## 2014-11-06 MED ORDER — PANTOPRAZOLE SODIUM 40 MG PO TBEC
40.0000 mg | DELAYED_RELEASE_TABLET | Freq: Every day | ORAL | Status: DC
  Administered 2014-11-07: 40 mg via ORAL
  Filled 2014-11-06: qty 1

## 2014-11-06 MED ORDER — SODIUM CHLORIDE 0.9 % IJ SOLN
3.0000 mL | Freq: Two times a day (BID) | INTRAMUSCULAR | Status: DC
  Administered 2014-11-06 – 2014-11-07 (×2): 3 mL via INTRAVENOUS

## 2014-11-06 MED ORDER — MORPHINE SULFATE 4 MG/ML IJ SOLN
4.0000 mg | Freq: Once | INTRAMUSCULAR | Status: AC
  Administered 2014-11-06: 4 mg via INTRAVENOUS
  Filled 2014-11-06: qty 1

## 2014-11-06 MED ORDER — HYDROMORPHONE HCL 1 MG/ML IJ SOLN
1.0000 mg | INTRAMUSCULAR | Status: DC | PRN
  Administered 2014-11-06 – 2014-11-07 (×3): 1 mg via INTRAVENOUS
  Filled 2014-11-06 (×3): qty 1

## 2014-11-06 MED ORDER — ONDANSETRON HCL 4 MG/2ML IJ SOLN
4.0000 mg | Freq: Once | INTRAMUSCULAR | Status: AC
  Administered 2014-11-06: 4 mg via INTRAVENOUS
  Filled 2014-11-06: qty 2

## 2014-11-06 MED ORDER — ONDANSETRON HCL 4 MG/2ML IJ SOLN: 4.0000 mg | Freq: Four times a day (QID) | INTRAMUSCULAR | Status: DC | PRN

## 2014-11-06 MED ORDER — OXYCODONE-ACETAMINOPHEN 5-325 MG PO TABS: 1.0000 | ORAL_TABLET | Freq: Three times a day (TID) | ORAL | Status: DC | PRN

## 2014-11-06 MED ORDER — ALPRAZOLAM 0.5 MG PO TABS
1.0000 mg | ORAL_TABLET | Freq: Three times a day (TID) | ORAL | Status: DC
Start: 2014-11-06 — End: 2014-11-07
  Administered 2014-11-07: 1 mg via ORAL
  Filled 2014-11-06 (×2): qty 2

## 2014-11-06 MED ORDER — ONDANSETRON HCL 4 MG PO TABS: 4.0000 mg | ORAL_TABLET | Freq: Four times a day (QID) | ORAL | Status: DC | PRN

## 2014-11-06 MED ORDER — ALUM & MAG HYDROXIDE-SIMETH 200-200-20 MG/5ML PO SUSP: 30.0000 mL | Freq: Four times a day (QID) | ORAL | Status: DC | PRN

## 2014-11-06 MED ORDER — LORAZEPAM 1 MG PO TABS
1.0000 mg | ORAL_TABLET | Freq: Once | ORAL | Status: AC
  Administered 2014-11-06: 1 mg via ORAL
  Filled 2014-11-06: qty 1

## 2014-11-06 MED ORDER — LAMOTRIGINE 200 MG PO TABS
400.0000 mg | ORAL_TABLET | Freq: Every day | ORAL | Status: DC
  Administered 2014-11-06: 400 mg via ORAL
  Filled 2014-11-06 (×2): qty 2

## 2014-11-06 MED ORDER — POLYETHYLENE GLYCOL 3350 17 G PO PACK
17.0000 g | PACK | Freq: Every day | ORAL | Status: DC | PRN
  Filled 2014-11-06: qty 1

## 2014-11-06 MED ORDER — VALACYCLOVIR HCL 500 MG PO TABS
500.0000 mg | ORAL_TABLET | Freq: Every day | ORAL | Status: DC
  Administered 2014-11-06 – 2014-11-07 (×2): 500 mg via ORAL
  Filled 2014-11-06 (×2): qty 1

## 2014-11-06 MED ORDER — ENOXAPARIN SODIUM 100 MG/ML ~~LOC~~ SOLN
1.0000 mg/kg | Freq: Two times a day (BID) | SUBCUTANEOUS | Status: DC
  Administered 2014-11-06 – 2014-11-07 (×2): 90 mg via SUBCUTANEOUS
  Filled 2014-11-06 (×4): qty 1

## 2014-11-06 MED ORDER — MORPHINE SULFATE 2 MG/ML IJ SOLN
2.0000 mg | INTRAMUSCULAR | Status: DC | PRN
  Filled 2014-11-06: qty 1

## 2014-11-06 MED ORDER — IOHEXOL 350 MG/ML SOLN
80.0000 mL | Freq: Once | INTRAVENOUS | Status: AC | PRN
  Administered 2014-11-06: 80 mL via INTRAVENOUS
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
## 2014-11-06 NOTE — ED Notes (Signed)
Patient assisted to bedside commode without difficulty.

## 2014-11-06 NOTE — ED Notes (Signed)
Spoke with charge RN and decided to move patient to POD C.  Did confirm with PA that she would take responsibility for patient until 1500 then turn over care to POD C provider.
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
## 2014-11-06 NOTE — ED Notes (Signed)
Patient states L hip surgery x 3 weeks ago (total hip replacement).   Patient states started having L behind the knee pain and L calf pain x 1 week ago.   Patient states she did see Dr. Turner Danielsowan today and he ordered a doppler.  Patient states doppler confirmed clots in L lower leg.   Patient states they directed her to ED.

## 2014-11-06 NOTE — Progress Notes (Signed)
*  PRELIMINARY RESULTS* Vascular Ultrasound Lower extremity venous duplex has been completed.  Preliminary findings: Positive for DVT in the left distal femoral vein, popliteal vein, and posterior tibial veins. No DVT RLE.   [facility] at Dr. Wadie Lessenowan's office. He instructed that patient report to the ED for treatment.    Farrel DemarkJill Eunice, RDMS, RVT  11/06/2014, 11:51 AM

## 2014-11-06 NOTE — ED Notes (Signed)
Gave report. Nurse stated room was not ready. Would call when room was clean.

## 2014-11-06 NOTE — ED Provider Notes (Signed)
MSE was initiated and I personally evaluated the patient and placed orders (if any) at  1:56 PM on November 06, 2014.  The patient appears stable so that the remainder of the MSE may be completed by another provider.  Tracy Zhang is a 44 y.o. female complaining of severe left calf pain onset 6 days ago with confirmed DVT on vascular duplex this morning. Patient is status post left total hip replacement by Dr. Elita Quickowen 3 weeks ago. She states that she missed 4 doses of her Lovenox just prior to the onset of her pain. Patient denies chest pain, shortness of breath, cough, hemoptysis. States that she has a month supply of her Lovenox at home.  Lung sounds are clear to auscultation bilaterally, heart is a regular rate and rhythm she is tachycardic in the 110s to 120s. Left calf is swollen, pulses 2+. No superficial collaterals. Homans sign is positive.  She will be moved to acute bed, blood work, cardiac monitoring,  morphine and CT angiogram to rule out PE initiated.   Verbal report given to PA Hess, who will follow up results and disposition  Tracy Emeryicole Mishon Blubaugh, PA-C 11/06/14 1634  Samuel JesterKathleen McManus, DO 11/06/14 1947

## 2014-11-06 NOTE — ED Notes (Signed)
Patient transported to CT 
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
## 2014-11-06 NOTE — ED Notes (Signed)
Patient transported by K[place]anisha, NT

## 2014-11-07 DIAGNOSIS — I82402 Acute embolism and thrombosis of unspecified deep veins of left lower extremity: Secondary | ICD-10-CM | POA: Insufficient documentation

## 2014-11-07 LAB — CBC
HCT: 33.6 % — ABNORMAL LOW (ref 36.0–46.0)
HEMOGLOBIN: 10.7 g/dL — AB (ref 12.0–15.0)
MCH: 28.5 pg (ref 26.0–34.0)
MCHC: 31.8 g/dL (ref 30.0–36.0)
MCV: 89.4 fL (ref 78.0–100.0)
PLATELETS: 453 10*3/uL — AB (ref 150–400)
RBC: 3.76 MIL/uL — ABNORMAL LOW (ref 3.87–5.11)
RDW: 13.8 % (ref 11.5–15.5)
WBC: 4.6 10*3/uL (ref 4.0–10.5)

## 2014-11-07 MED ORDER — OMEPRAZOLE 20 MG PO CPDR: 20.0000 mg | DELAYED_RELEASE_CAPSULE | Freq: Every day | ORAL | Status: DC

## 2014-11-07 MED ORDER — RIVAROXABAN 15 MG PO TABS
15.0000 mg | ORAL_TABLET | Freq: Two times a day (BID) | ORAL | Status: DC
  Filled 2014-11-07 (×2): qty 1

## 2014-11-07 MED ORDER — RIVAROXABAN 15 MG PO TABS: 15.0000 mg | ORAL_TABLET | Freq: Two times a day (BID) | ORAL | Status: DC

## 2014-11-07 MED ORDER — POLYETHYLENE GLYCOL 3350 17 G PO PACK: 17.0000 g | PACK | Freq: Every day | ORAL | Status: DC | PRN

## 2014-11-07 MED ORDER — ALPRAZOLAM 1 MG PO TABS: 1.0000 mg | ORAL_TABLET | Freq: Three times a day (TID) | ORAL | Status: DC | PRN

## 2014-11-07 MED ORDER — RIVAROXABAN 20 MG PO TABS: 20.0000 mg | ORAL_TABLET | Freq: Every day | ORAL | Status: DC

## 2014-11-07 MED ORDER — CARISOPRODOL 350 MG PO TABS: 350.0000 mg | ORAL_TABLET | Freq: Every evening | ORAL | Status: AC | PRN
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
## 2014-11-07 NOTE — Care Management Note (Signed)
    Page 1 of 2   11/07/2014     12:03:33 PM CARE MANAGEMENT NOTE 11/07/2014  Patient:  Tracy Zhang,Tracy Zhang   Account Number:  [id number]  Date Initiated:  11/07/2014  Documentation initiated by:  Donn PieriniWEBSTER,Semisi Biela  Subjective/Objective Assessment:   Pt admitted with PE/DVT     Action/Plan:   PTA pt lived at home   Anticipated DC Date:  11/07/2014   Anticipated DC Plan:  HOME/SELF CARE      DC Planning Services  CM consult  Medication Assistance      Choice offered to / List presented to:             Status of service:  Completed, signed off Medicare Important Message given?   (If response is "NO", the following Medicare IM given date fields will be blank) Date Medicare IM given:   Medicare IM given by:   Date Additional Medicare IM given:   Additional Medicare IM given by:    Discharge Disposition:  HOME/SELF CARE  Per UR Regulation:  Reviewed for med. necessity/level of care/duration of stay  If discussed at Long Length of Stay Meetings, dates discussed:    Comments:  11/07/14- 1145- Donn PieriniKristi Timothee Gali RN, BSN [phone] request for benefit check eliquis vs xarelto [facility] # [phone]  **** ELIQUIS 10 MG NOT ON FORM ****  ** ELIQUIS 5 MG X 4 COVER- YES CO-PAY- $ 336.74  30 DAYS SUPPLY A QUANTILY LIMITED 60 TIER-4 DRUG PRIOR APPROVAL - YES # [phone]  PRICE MIGHT CHANGE DUE TO COVERAGE [facility]  # [phone]             [facility], [facility]  ** XARELTO 15 MG BID  X 3 WKS COVER- YES CO-PAY- $ 47.00 30 DAY SUPPLY A QUANTILY LIMITED  60 TIER-3 DRUG PRIOR APPROVAL -NO PHARMACY : [facility] [phone] [facility], [facility]  ** XARELTO 20 MG DAILY COVER -YES CO-PAY- $ 47.00 A QUANTILY LIMITED OF 30 TIER - 3 DRUG PRIRO APPROVAL -NO PHARMACY - [facility] # [phone]    [facility] , [facility]   spoke with Dr. Zella RicherMadera---plan is to place pt on Xarelto- will  give pt 30 day free card for Xarelto

## 2014-11-07 NOTE — Progress Notes (Signed)
UR completed 

## 2014-11-07 NOTE — Progress Notes (Signed)
Patient ID: Myles GipLinda Lampe, female   DOB: 07/14/1971, 44 y.o.   MRN: [id number] S: Patient was seen in the office yesterday 20 days after left total hip arthroplasty. Some pain and swelling to the ipsilateral posterior knee and calf. She was sent for a Doppler the came back positive for a DVT in the calf as well as the superficial femoral vein. On further questioning after her discharge from the hospital she stated on Lovenox for 5 days but had only received that much from a pharmacist and there was a 5-6 day gap in the Lovenox dosing before we saw her back in the office and emphasized the importance of taking the Lovenox. A few days after that her knee and calf pain started. In addition she continues to smoke a pack of cigarettes a day and understands this was while the risk factors for development of her DVT and subsequent pulmonary embolus was diagnosed yesterday on CT scan. She is resting comfortably today on the medicine service on the telemetry unit. She denies any shortness of breath or chest pains today.  Objective: Surgical wound to her left hip remains healed there is no erythema no drainage and minimal swelling. The tenderness to the posterior left knee and calf has diminished. Normal sensation to her foot good power testing of the dorsiflexors and plantar flexors.  Assessment: Status post left total up arthroplasty 21 days ago. Was on Lovenox at discharge and continue that for 5 days and then there was a 5-6 day gap. Restarted Lovenox but still 1 onto develop a DVT and pulmonary embolus diagnosed yesterday after we saw her in the office postoperatively.  Plan: Regarding the total hip she is weightbearing as tolerated and at this time does not need any assistive devices. Regarding the pulmonary embolus and DVT that will be managed by the hospitalist service and I suspect she'll be on anticoagulation for at least 6 months. I once again asked her to stop smoking but it is unlikely that will happen area

## 2014-11-07 NOTE — Progress Notes (Signed)
Discharge education completed by RN. Pt and spouse received a copy of discharge paperwork and confirm understanding of follow up appointments and discharge medications. Both deny any questions at this time. IV removed, site is within normal limits. Pt will discharge from the unit via wheelchair. 

## 2014-11-07 NOTE — Progress Notes (Signed)
Pt admitted to 2W36 from the ED.  Pt placed on telemetry and CCMD notified.  Pt A&Ox4, VSS.  Call bell within reach.  Will continue to monitor closely.  Erenest Rasheraldwell,Jabre Heo B, RN

## 2014-11-07 NOTE — Discharge Summary (Signed)
Physician Discharge Summary  Myles GipLinda Tangredi WJX:[id number] DOB: 02/08/1971 DOA: 11/06/2014  PCP: [place], BETTY G, MD  Admit date: 11/06/2014 Discharge date: 11/07/2014  Time spent: 30 minutes  Recommendations for Outpatient Follow-up:  CBC to follow Hgb trend BMET to follow renal function and electrolytes  Discharge Diagnoses:  Principal Problem:   Pulmonary emboli Active Problems:   Raynaud's disease   ADD (attention deficit disorder)   Chronic pain syndrome   Primary osteoarthritis of left hip   DVT, recurrent, lower extremity, acute   History of total left hip arthroplasty   Pulmonary embolism   GERD (gastroesophageal reflux disease)   Discharge Condition: stable and improved. No SOB, vital stable and no signs of bleeding.  Diet recommendation: regular diet  Filed Weights   11/06/14 1700 11/07/14 0403  Weight: 89.8 kg (197 lb 15.6 oz) 89.7 kg (197 lb 12 oz)    History of present illness:  44 y.o. female with a Past Medical History of Raynaud's phenomenon, recent left hip arthroplasty who presents today with the above noted complaint. Per patient, she was discharged from the hospital following a left hip arthroplasty on 1/23. Patient was placed on Lovenox for DVT prophylaxis, unfortunately last week this was interrupted for 4 days as she ran out of these medications. Since then she's been having pain in her left calf and behind her left knee. She subsequently went to Dr. Wadie Lessenowan's office today for routine follow-up, she was then sent to the ED for lower extremity Doppler which is positive for lower extended DVT. In the ED she was noted to be tachycardic, this led to a CT angiogram of the chest which was positive. I will subsequent gastric admit this patient for further evaluation and treatment.  History of headache, fever, shortness of breath, nausea, vomiting or diarrhea.  Hospital Course:  . Pulmonary emboli with left leg DVT:  -no SOB and no abnormalities seen on  telemetry -patient with stable vital signs -will discharge on xarelto for treatment of PE and DVT -This is a provoked deep vein thrombosis and would likely need treatment for 6 months.   . Primary osteoarthritis of left hip-status post recent left hip replacement: Continue with supportive care and rec's from orthopedic surgery  . GERD (gastroesophageal reflux disease): Continue PPI   . tobacco abuse: cessation counseling provided. Patient is not ready to quit  Chronic pain syndrome: Continue PRN pain meds  *rest of her medical problems remains stable and the plan is for patient to continue current medication regimen.  Procedures:  See below for x-ray reports   Consultations:  None   Discharge Exam: Filed Vitals:   11/07/14 1427  BP: 113/56  Pulse: 103  Temp: 98.5 F (36.9 C)  Resp: 18    General: AAOX3, mild CP, no SOB, no fever. Reports pain in her LLE Cardiovascular: S1 and S2, no rubs or gallops Respiratory: CTA bilaterally  Extremities: left lower extremity swollen and tender; RLE with trace edema, no pain on palpation Skin: with left hip surgical wound healed, no erythema and no discharge  Discharge Instructions   Discharge Instructions    Discharge instructions    Complete by:  As directed   Take medications as prescribed Please arrange hospital follow up with PCP in 10 days Avoid the use of NSAID's (can increase bleeding as you are taking blood thinners now)          Current Discharge Medication List    START taking these medications   Details  polyethylene  glycol (MIRALAX / GLYCOLAX) packet Take 17 g by mouth daily as needed for mild constipation. Qty: 14 each, Refills: 0    !! Rivaroxaban (XARELTO) 15 MG TABS tablet Take 1 tablet (15 mg total) by mouth 2 (two) times daily with a meal. Qty: 42 tablet, Refills: 0    !! rivaroxaban (XARELTO) 20 MG TABS tablet Take 1 tablet (20 mg total) by mouth daily with supper. Qty: 30 tablet, Refills: 1      !! - Potential duplicate medications found. Please discuss with provider.    CONTINUE these medications which have CHANGED   Details  ALPRAZolam (XANAX) 1 MG tablet Take 1 tablet (1 mg total) by mouth 3 (three) times daily as needed.    carisoprodol (SOMA) 350 MG tablet Take 1 tablet (350 mg total) by mouth at bedtime as needed for muscle spasms.    omeprazole (PRILOSEC) 20 MG capsule Take 1 capsule (20 mg total) by mouth daily. Qty: 30 capsule, Refills: 0      CONTINUE these medications which have NOT CHANGED   Details  Acetaminophen-Caff-Pyrilamine (MIDOL COMPLETE) 500-60-15 MG TABS Take 2 tablets by mouth every 4 (four) hours as needed (cramps).    amphetamine-dextroamphetamine (ADDERALL) 20 MG tablet Take 1 tablet (20 mg total) by mouth 2 (two) times daily. Qty: 60 tablet, Refills: 0   Associated Diagnoses: ADD (attention deficit disorder)    lamoTRIgine (LAMICTAL) 200 MG tablet Take 2 tablets (400 mg total) by mouth at bedtime. Qty: 30 tablet, Refills: 2   Associated Diagnoses: Anxiety state, unspecified    oxyCODONE-acetaminophen (PERCOCET) 10-325 MG per tablet Take 1 tablet by mouth every 6 (six) hours as needed for pain. Qty: 60 tablet, Refills: 0   Associated Diagnoses: Chronic pain syndrome; Primary osteoarthritis of left hip    valACYclovir (VALTREX) 500 MG tablet Take 1 tablet (500 mg total) by mouth daily. Qty: 30 tablet, Refills: 11   Associated Diagnoses: Herpes simplex    albuterol (PROVENTIL HFA;VENTOLIN HFA) 108 (90 BASE) MCG/ACT inhaler Inhale 2 puffs into the lungs every 6 (six) hours as needed for wheezing. Qty: 1 Inhaler, Refills: 2   Associated Diagnoses: Sinusitis    clotrimazole-betamethasone (LOTRISONE) cream Apply 1 application topically 2 (two) times daily. Qty: 30 g, Refills: 0   Associated Diagnoses: Rash and nonspecific skin eruption    fluticasone (FLONASE) 50 MCG/ACT nasal spray Place 2 sprays into both nostrils daily. Qty: 16 g, Refills: 3    Associated Diagnoses: Allergic rhinitis    temazepam (RESTORIL) 7.5 MG capsule Take 1 capsule (7.5 mg total) by mouth at bedtime as needed for sleep (sleep). Qty: 30 capsule, Refills: 3   Associated Diagnoses: Anxiety state, unspecified      STOP taking these medications     enoxaparin (LOVENOX) 40 MG/0.4ML injection      naproxen (NAPROSYN) 500 MG tablet      amLODipine (NORVASC) 2.5 MG tablet      OxyCODONE (OXYCONTIN) 20 mg T12A 12 hr tablet        Allergies  Allergen Reactions  . Keflex [Cephalexin] Other (See Comments)    Causes yeast infection   Follow-up Information    Follow up with [place], Timoteo Expose, MD. Schedule an appointment as soon as possible for a visit in 10 days.   Specialty:  Family Medicine   Contact information:   [address] [place] [address] [phone]       The results of significant diagnostics from this hospitalization (including imaging,  microbiology, ancillary and laboratory) are listed below for reference.    Significant Diagnostic Studies: Dg Chest 2 View  10/09/2014   CLINICAL DATA:  Hip replacement.  Smoker.  COPD.  EXAM: CHEST  2 VIEW  COMPARISON:  None.  FINDINGS: The heart size and mediastinal contours are within normal limits. Both lungs are clear. The visualized skeletal structures are unremarkable.  IMPRESSION: No active cardiopulmonary disease.   Electronically Signed   By: Maisie Fus  Register   On: 10/09/2014 08:45   Ct Angio Chest Pe W/cm &/or Wo Cm  11/06/2014   CLINICAL DATA:  Deep venous thrombosis. Recent left hip replacement.  EXAM: CT ANGIOGRAPHY CHEST WITH CONTRAST  TECHNIQUE: Multidetector CT imaging of the chest was performed using the standard protocol during bolus administration of intravenous contrast. Multiplanar CT image reconstructions and MIPs were obtained to evaluate the vascular anatomy.  CONTRAST:  80mL OMNIPAQUE IOHEXOL 350 MG/ML SOLN  COMPARISON:  Chest x-ray dated 10/08/2014  FINDINGS: There is a tiny  pulmonary embolus and a branch vessel of the right lower lobe seen on image 156 of series 6 and image 93 of series 8. There is also suggestion of a branch pulmonary embolism in a left lower lobe artery on image 143 of series 6.  No other pulmonary emboli.  RV/LV ratio is normal.  There is a 4 mm nodule in the right lower lobe on image 121 of series 6. This is indeterminate.  No hilar or mediastinal adenopathy. Heart size is normal. Visualized portion of the upper abdomen is normal. No osseous abnormality. No effusions. No infiltrates.  Review of the MIP images confirms the above findings.  IMPRESSION: Tiny solitary pulmonary emboli in each lower lobe.  4 mm nodule in the right lower lobe. If the patient is at high risk for bronchogenic carcinoma, follow-up chest CT at 1 year is recommended. If the patient is at low risk, no follow-up is needed. This recommendation follows the consensus statement: Guidelines for Management of Small Pulmonary Nodules Detected on CT Scans: A Statement from the Fleischner Society as published in Radiology 2005; 237:395-400.   Electronically Signed   By: Francene Boyers M.D.   On: 11/06/2014 17:29   Labs: Basic Metabolic Panel:  Recent Labs Lab 11/06/14 1431  NA 136  K 4.2  CL 102  CO2 23  GLUCOSE 84  BUN 12  CREATININE 0.67  CALCIUM 9.0   CBC:  Recent Labs Lab 11/06/14 1431 11/07/14 0532  WBC 6.6 4.6  NEUTROABS 4.0  --   HGB 11.4* 10.7*  HCT 34.9* 33.6*  MCV 87.7 89.4  PLT 430* 453*   Signed:  Vassie Loll  [facility] 11/07/2014, 2:30 PM

## 2014-11-07 NOTE — Discharge Instructions (Signed)
Information on my medicine - XARELTO (rivaroxaban)  This medication education was reviewed with me or my healthcare representative as part of my discharge preparation.  The pharmacist that spoke with me during my hospital stay was:  Tera Materoole, Devanny Palecek L, [facility]  WHY WAS Tracy HurlXARELTO PRESCRIBED FOR YOU? Xarelto was prescribed to treat blood clots that may have been found in the veins of your legs (deep vein thrombosis) or in your lungs (pulmonary embolism) and to reduce the risk of them occurring again.  What do you need to know about Xarelto? The starting dose is one 15 mg tablet taken TWICE daily with food for the FIRST 21 DAYS then on (enter date)  11/28/14  the dose is changed to one 20 mg tablet taken ONCE A DAY with your evening meal.  DO NOT stop taking Xarelto without talking to the health care provider who prescribed the medication.  Refill your prescription for 20 mg tablets before you run out.  After discharge, you should have regular check-up appointments with your healthcare provider that is prescribing your Xarelto.  In the future your dose may need to be changed if your kidney function changes by a significant amount.  What do you do if you miss a dose? If you are taking Xarelto TWICE DAILY and you miss a dose, take it as soon as you remember. You may take two 15 mg tablets (total 30 mg) at the same time then resume your regularly scheduled 15 mg twice daily the next day.  If you are taking Xarelto ONCE DAILY and you miss a dose, take it as soon as you remember on the same day then continue your regularly scheduled once daily regimen the next day. Do not take two doses of Xarelto at the same time.   Important Safety Information Xarelto is a blood thinner medicine that can cause bleeding. You should call your healthcare provider right away if you experience any of the following: ? Bleeding from an injury or your nose that does not stop. ? Unusual colored urine (red or dark brown) or  unusual colored stools (red or black). ? Unusual bruising for unknown reasons. ? A serious fall or if you hit your head (even if there is no bleeding).  Some medicines may interact with Xarelto and might increase your risk of bleeding while on Xarelto. To help avoid this, consult your healthcare provider or pharmacist prior to using any new prescription or non-prescription medications, including herbals, vitamins, non-steroidal anti-inflammatory drugs (NSAIDs) and supplements.  This website has more information on Xarelto: [email or web address].xarelto.[email or web address].

## 2015-09-25 ENCOUNTER — Other Ambulatory Visit: Payer: Self-pay | Admitting: Family Medicine

## 2016-11-02 ENCOUNTER — Emergency Department (HOSPITAL_COMMUNITY): Payer: Medicare Other

## 2016-11-02 ENCOUNTER — Encounter (HOSPITAL_COMMUNITY): Payer: Self-pay | Admitting: Emergency Medicine

## 2016-11-02 ENCOUNTER — Emergency Department (HOSPITAL_COMMUNITY)
Admission: EM | Admit: 2016-11-02 | Discharge: 2016-11-02 | Disposition: A | Discharge status: Home / Self Care | Payer: Medicare Other | Attending: Emergency Medicine | Admitting: Emergency Medicine

## 2016-11-02 ENCOUNTER — Other Ambulatory Visit: Payer: Self-pay

## 2016-11-02 ENCOUNTER — Emergency Department (HOSPITAL_BASED_OUTPATIENT_CLINIC_OR_DEPARTMENT_OTHER)
Admit: 2016-11-02 | Discharge: 2016-11-02 | Disposition: A | Discharge status: Home / Self Care | Payer: Medicare Other | Attending: Emergency Medicine | Admitting: Emergency Medicine

## 2016-11-02 ENCOUNTER — Encounter (HOSPITAL_COMMUNITY): Payer: Self-pay

## 2016-11-02 DIAGNOSIS — Z96642 Presence of left artificial hip joint: Secondary | ICD-10-CM | POA: Insufficient documentation

## 2016-11-02 DIAGNOSIS — Z7901 Long term (current) use of anticoagulants: Secondary | ICD-10-CM | POA: Insufficient documentation

## 2016-11-02 DIAGNOSIS — F17210 Nicotine dependence, cigarettes, uncomplicated: Secondary | ICD-10-CM | POA: Diagnosis not present

## 2016-11-02 DIAGNOSIS — Z79899 Other long term (current) drug therapy: Secondary | ICD-10-CM | POA: Diagnosis not present

## 2016-11-02 DIAGNOSIS — I10 Essential (primary) hypertension: Secondary | ICD-10-CM | POA: Insufficient documentation

## 2016-11-02 DIAGNOSIS — R0602 Shortness of breath: Secondary | ICD-10-CM | POA: Diagnosis present

## 2016-11-02 DIAGNOSIS — Z5321 Procedure and treatment not carried out due to patient leaving prior to being seen by health care provider: Secondary | ICD-10-CM | POA: Diagnosis not present

## 2016-11-02 DIAGNOSIS — M79609 Pain in unspecified limb: Secondary | ICD-10-CM

## 2016-11-02 DIAGNOSIS — M79605 Pain in left leg: Secondary | ICD-10-CM | POA: Diagnosis not present

## 2016-11-02 DIAGNOSIS — M7989 Other specified soft tissue disorders: Secondary | ICD-10-CM | POA: Diagnosis not present

## 2016-11-02 LAB — CBC WITH DIFFERENTIAL/PLATELET
BASOS PCT: 1 %
Basophils Absolute: 0 10*3/uL (ref 0.0–0.1)
EOS PCT: 2 %
Eosinophils Absolute: 0.2 10*3/uL (ref 0.0–0.7)
HCT: 37.4 % (ref 36.0–46.0)
HEMOGLOBIN: 12.4 g/dL (ref 12.0–15.0)
LYMPHS ABS: 2.2 10*3/uL (ref 0.7–4.0)
Lymphocytes Relative: 31 %
MCH: 28.6 pg (ref 26.0–34.0)
MCHC: 33.2 g/dL (ref 30.0–36.0)
MCV: 86.2 fL (ref 78.0–100.0)
MONOS PCT: 6 %
Monocytes Absolute: 0.4 10*3/uL (ref 0.1–1.0)
NEUTROS PCT: 60 %
Neutro Abs: 4.2 10*3/uL (ref 1.7–7.7)
PLATELETS: 307 10*3/uL (ref 150–400)
RBC: 4.34 MIL/uL (ref 3.87–5.11)
RDW: 14.4 % (ref 11.5–15.5)
WBC: 7 10*3/uL (ref 4.0–10.5)

## 2016-11-02 LAB — BASIC METABOLIC PANEL
Anion gap: 7 (ref 5–15)
BUN: 13 mg/dL (ref 6–20)
CALCIUM: 9.2 mg/dL (ref 8.9–10.3)
CHLORIDE: 106 mmol/L (ref 101–111)
CO2: 24 mmol/L (ref 22–32)
CREATININE: 0.62 mg/dL (ref 0.44–1.00)
GFR calc non Af Amer: >60 mL/min (ref >60)
Glucose, Bld: 84 mg/dL (ref 65–99)
Potassium: 4 mmol/L (ref 3.5–5.1)
SODIUM: 137 mmol/L (ref 135–145)

## 2016-11-02 LAB — POC URINE PREG, ED: PREG TEST UR: NEGATIVE

## 2016-11-02 MED ORDER — IOPAMIDOL (ISOVUE-370) INJECTION 76%
100.0000 mL | Freq: Once | INTRAVENOUS | Status: AC | PRN
  Administered 2016-11-02: 100 mL via INTRAVENOUS

## 2016-11-02 MED ORDER — RIVAROXABAN (XARELTO) VTE STARTER PACK (15 & 20 MG): ORAL_TABLET | ORAL | 0 refills | Status: AC

## 2016-11-02 NOTE — ED Triage Notes (Signed)
Pt states hx of blood clots; pt states 2 days ago she noticed pain and redness and warmth to the back left knee; pt ambulated to triage; pt c/o pain 8/10 on arrival; pt states SOB on arrival but able to speak in full sentences; pt a&ox 4

## 2016-11-02 NOTE — ED Notes (Signed)
Called patient for room without response. Previous nurse reported that patient had expressed she wanted to leave. Pt discharged from system.

## 2016-11-02 NOTE — ED Notes (Signed)
[facility] to be taken out of system. Never seen by this RN or placed in the room.
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
## 2016-11-02 NOTE — Progress Notes (Signed)
*  PRELIMINARY RESULTS* Vascular Ultrasound Left lower extremity venous duplex has been completed.  Preliminary findings: No evidence of acute deep vein thrombosis in the left lower extremity.  Questionable chronic changes in the left popliteal vein but area is patent, phasic and compressible.  Negative for baker's cyst.  Preliminary results given to RN @ 8:35   Chauncey FischerCharlotte C Claudie Rathbone 11/02/2016, 8:39 AM

## 2016-11-02 NOTE — ED Triage Notes (Signed)
Patient reports hx of blood clots. Reports pain/swelling behind left knee x2 days Patient has not been taking blood thinners for 6 months.

## 2016-11-02 NOTE — Discharge Instructions (Signed)
IT IS VERY IMPORTANT FOR YOU TO FOLLOW UP WITH PRIMARY CARE PROVIDER AS SOON AS POSSIBLE TO CONTINUE XARELTO. IF YOU CONTINUE TO HAVE LEG PAIN, GET A FOLLOW UP ULTRASOUND IN 5-7 DAYS. RETURN IMMEDIATELY FOR SEVERE WORSENING OF PAIN/SWELLING, SUDDEN SHORTNESS OF BREATH, OR CHEST PAIN.

## 2016-11-02 NOTE — ED Provider Notes (Signed)
Patient left without being seen, I did not see or evaluate them.   Lyndal Pulleyaniel Hiyab Nhem, MD 11/02/16 [phone]

## 2017-03-23 ENCOUNTER — Encounter (HOSPITAL_BASED_OUTPATIENT_CLINIC_OR_DEPARTMENT_OTHER): Payer: Self-pay

## 2017-03-23 ENCOUNTER — Emergency Department (HOSPITAL_BASED_OUTPATIENT_CLINIC_OR_DEPARTMENT_OTHER)
Admission: EM | Admit: 2017-03-23 | Discharge: 2017-03-23 | Disposition: A | Discharge status: Home / Self Care | Payer: Medicare Other | Attending: Emergency Medicine | Admitting: Emergency Medicine

## 2017-03-23 ENCOUNTER — Emergency Department (HOSPITAL_BASED_OUTPATIENT_CLINIC_OR_DEPARTMENT_OTHER): Payer: Medicare Other

## 2017-03-23 DIAGNOSIS — Y929 Unspecified place or not applicable: Secondary | ICD-10-CM | POA: Insufficient documentation

## 2017-03-23 DIAGNOSIS — S098XXA Other specified injuries of head, initial encounter: Secondary | ICD-10-CM | POA: Diagnosis not present

## 2017-03-23 DIAGNOSIS — Y93I9 Activity, other involving external motion: Secondary | ICD-10-CM | POA: Insufficient documentation

## 2017-03-23 DIAGNOSIS — S161XXA Strain of muscle, fascia and tendon at neck level, initial encounter: Secondary | ICD-10-CM | POA: Diagnosis not present

## 2017-03-23 DIAGNOSIS — S0001XA Abrasion of scalp, initial encounter: Secondary | ICD-10-CM | POA: Diagnosis not present

## 2017-03-23 DIAGNOSIS — Z885 Allergy status to narcotic agent status: Secondary | ICD-10-CM | POA: Diagnosis not present

## 2017-03-23 DIAGNOSIS — S0990XA Unspecified injury of head, initial encounter: Secondary | ICD-10-CM

## 2017-03-23 DIAGNOSIS — F909 Attention-deficit hyperactivity disorder, unspecified type: Secondary | ICD-10-CM | POA: Insufficient documentation

## 2017-03-23 DIAGNOSIS — Z79899 Other long term (current) drug therapy: Secondary | ICD-10-CM | POA: Insufficient documentation

## 2017-03-23 DIAGNOSIS — S199XXA Unspecified injury of neck, initial encounter: Secondary | ICD-10-CM | POA: Diagnosis present

## 2017-03-23 DIAGNOSIS — F17210 Nicotine dependence, cigarettes, uncomplicated: Secondary | ICD-10-CM | POA: Diagnosis not present

## 2017-03-23 DIAGNOSIS — Y999 Unspecified external cause status: Secondary | ICD-10-CM | POA: Insufficient documentation

## 2017-03-23 DIAGNOSIS — Z7902 Long term (current) use of antithrombotics/antiplatelets: Secondary | ICD-10-CM | POA: Diagnosis not present

## 2017-03-23 DIAGNOSIS — I10 Essential (primary) hypertension: Secondary | ICD-10-CM | POA: Insufficient documentation

## 2017-03-23 MED ORDER — NAPROXEN 250 MG PO TABS
500.0000 mg | ORAL_TABLET | Freq: Once | ORAL | Status: AC
  Administered 2017-03-23: 500 mg via ORAL
  Filled 2017-03-23: qty 2

## 2017-03-23 MED ORDER — NAPROXEN 500 MG PO TABS: ORAL_TABLET | ORAL | 0 refills | Status: DC
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
## 2017-03-23 NOTE — ED Notes (Signed)
Pt is awake at this time, ambulatory to discharge, denies any further needs at this time.

## 2017-03-23 NOTE — ED Notes (Signed)
Pt returned from CT °

## 2017-03-23 NOTE — ED Notes (Signed)
Patient transported to CT 
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
## 2017-03-23 NOTE — ED Notes (Signed)
ED Provider at bedside. 

## 2017-03-23 NOTE — ED Notes (Signed)
Pt and spouse are sound asleep and will not wake for discharge.

## 2017-03-23 NOTE — ED Notes (Signed)
Family at bedside. 
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
## 2021-10-21 ENCOUNTER — Ambulatory Visit: Payer: Self-pay | Admitting: Internal Medicine

## 2021-11-18 ENCOUNTER — Emergency Department
Admission: EM | Admit: 2021-11-18 | Discharge: 2021-11-18 | Disposition: A | Discharge status: Home / Self Care | Payer: Medicaid Other | Attending: Emergency Medicine | Admitting: Emergency Medicine

## 2021-11-18 ENCOUNTER — Encounter: Payer: Self-pay | Admitting: Emergency Medicine

## 2021-11-18 ENCOUNTER — Other Ambulatory Visit: Payer: Self-pay

## 2021-11-18 DIAGNOSIS — Z96642 Presence of left artificial hip joint: Secondary | ICD-10-CM | POA: Insufficient documentation

## 2021-11-18 DIAGNOSIS — L923 Foreign body granuloma of the skin and subcutaneous tissue: Secondary | ICD-10-CM

## 2021-11-18 DIAGNOSIS — I10 Essential (primary) hypertension: Secondary | ICD-10-CM | POA: Diagnosis not present

## 2021-11-18 DIAGNOSIS — L818 Other specified disorders of pigmentation: Secondary | ICD-10-CM | POA: Insufficient documentation

## 2021-11-18 MED ORDER — BACITRACIN 500 UNIT/GM EX OINT: 1.0000 "application " | TOPICAL_OINTMENT | Freq: Two times a day (BID) | CUTANEOUS | 0 refills | Status: DC
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
## 2021-11-18 NOTE — ED Triage Notes (Signed)
States had a tattoo placed to left upper arm yesterday.  Today c/o rash around tattoo, warm to touch and blisters to area.

## 2021-11-18 NOTE — Discharge Instructions (Signed)
I do not think your tattoo is infected.  Please apply the bacitracin ointment to it twice a day.  If you start noticing redness that extends beyond the borders of the tattoo this is potentially concerning for infection.  Keep the tattoo open and wash it daily.

## 2022-04-14 ENCOUNTER — Encounter: Payer: Self-pay | Admitting: Emergency Medicine

## 2022-04-14 ENCOUNTER — Emergency Department
Admission: EM | Admit: 2022-04-14 | Discharge: 2022-04-14 | Disposition: A | Discharge status: Home / Self Care | Payer: Medicaid Other | Attending: Emergency Medicine | Admitting: Emergency Medicine

## 2022-04-14 ENCOUNTER — Other Ambulatory Visit: Payer: Self-pay

## 2022-04-14 DIAGNOSIS — I10 Essential (primary) hypertension: Secondary | ICD-10-CM | POA: Diagnosis not present

## 2022-04-14 DIAGNOSIS — S31119A Laceration without foreign body of abdominal wall, unspecified quadrant without penetration into peritoneal cavity, initial encounter: Secondary | ICD-10-CM | POA: Diagnosis not present

## 2022-04-14 DIAGNOSIS — W231XXA Caught, crushed, jammed, or pinched between stationary objects, initial encounter: Secondary | ICD-10-CM | POA: Diagnosis not present

## 2022-04-14 DIAGNOSIS — S3991XA Unspecified injury of abdomen, initial encounter: Secondary | ICD-10-CM | POA: Diagnosis present

## 2022-04-14 MED ORDER — LIDOCAINE HCL (PF) 1 % IJ SOLN
5.0000 mL | Freq: Once | INTRAMUSCULAR | Status: AC
  Administered 2022-04-14: 5 mL via INTRADERMAL
  Filled 2022-04-14: qty 5

## 2022-04-14 NOTE — ED Provider Triage Note (Signed)
Emergency Medicine Provider Triage Evaluation Note  Tracy Zhang, a 51 y.o. female  was evaluated in triage.  Pt complains of naval wound.  She inadvertently got her belly piercing caught on something, and pulled the barbell stud out.  She presents with a wound to the superior aspect of the navel.  No active bleeding reported.  Review of Systems  Positive: Umbilical wound Negative: FCS  Physical Exam  Ht 5\' 9"  (1.753 m)   Wt 93 kg   LMP 02/25/2017   BMI 30.28 kg/m  Gen:   Awake, no distress   Resp:  Normal effort  MSK:   Moves extremities without difficulty  SKIN:  Superficial wound to the superior naval region  Medical Decision Making  Medically screening exam initiated at 5:09 PM.  Appropriate orders placed.  Nyssa Sayegh was informed that the remainder of the evaluation will be completed by another provider, this initial triage assessment does not replace that evaluation, and the importance of remaining in the ED until their evaluation is complete.  Patient to the ED for traumatic wound to the umbilicus after her navel piercing got inadvertently pulled out earlier today.   Myles Gip, PA-C 04/14/22 1713

## 2022-04-14 NOTE — Discharge Instructions (Signed)
Do not get the sutured area wet for 24 hours. After 24 hours, shower/bathe as usual and pat the area dry. °Change the bandage 2 times per day and apply antibiotic ointment. °Leave open to air when at no risk of getting the area dirty, but cover at night before bed. °See your PCP or go to Urgent Care in 10 days for suture removal or sooner for signs or concern of infection. ° °

## 2022-04-14 NOTE — ED Triage Notes (Signed)
Bellybutton  ring torn out of whole.
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
## 2024-05-08 ENCOUNTER — Emergency Department
Admission: EM | Admit: 2024-05-08 | Discharge: 2024-05-08 | Disposition: A | Discharge status: Home / Self Care | Attending: Emergency Medicine | Admitting: Emergency Medicine

## 2024-05-08 ENCOUNTER — Emergency Department

## 2024-05-08 ENCOUNTER — Other Ambulatory Visit: Payer: Self-pay

## 2024-05-08 DIAGNOSIS — I10 Essential (primary) hypertension: Secondary | ICD-10-CM | POA: Insufficient documentation

## 2024-05-08 DIAGNOSIS — Z7901 Long term (current) use of anticoagulants: Secondary | ICD-10-CM | POA: Diagnosis not present

## 2024-05-08 DIAGNOSIS — R0789 Other chest pain: Secondary | ICD-10-CM | POA: Diagnosis present

## 2024-05-08 DIAGNOSIS — R079 Chest pain, unspecified: Secondary | ICD-10-CM

## 2024-05-08 LAB — CBC
HCT: 38.6 % (ref 36.0–46.0)
Hemoglobin: 12.3 g/dL (ref 12.0–15.0)
MCH: 28.5 pg (ref 26.0–34.0)
MCHC: 31.9 g/dL (ref 30.0–36.0)
MCV: 89.4 fL (ref 80.0–100.0)
Platelets: 318 K/uL (ref 150–400)
RBC: 4.32 MIL/uL (ref 3.87–5.11)
RDW: 15.5 % (ref 11.5–15.5)
WBC: 5.8 K/uL (ref 4.0–10.5)
nRBC: 0 % (ref 0.0–0.2)

## 2024-05-08 LAB — BASIC METABOLIC PANEL WITH GFR
Anion gap: 7 (ref 5–15)
BUN: 14 mg/dL (ref 6–20)
CO2: 26 mmol/L (ref 22–32)
Calcium: 9.2 mg/dL (ref 8.9–10.3)
Chloride: 107 mmol/L (ref 98–111)
Creatinine, Ser: 0.63 mg/dL (ref 0.44–1.00)
GFR, Estimated: >60 mL/min (ref >60)
Glucose, Bld: 88 mg/dL (ref 70–99)
Potassium: 4.3 mmol/L (ref 3.5–5.1)
Sodium: 140 mmol/L (ref 135–145)

## 2024-05-08 LAB — TROPONIN I (HIGH SENSITIVITY)
Troponin I (High Sensitivity): 3 ng/L (ref <18)
Troponin I (High Sensitivity): 9 ng/L (ref <18)

## 2024-05-08 NOTE — ED Triage Notes (Signed)
 Pt to ED via POV from work. Pt reports left sided CP and arm pain. Pt reports baseline SOB with a hx of blood clots. Pt is on blood thinner.

## 2024-05-08 NOTE — ED Provider Notes (Signed)
 [facility] Provider Note    Event Date/Time   First MD Initiated Contact with Patient 05/08/24 1708     (approximate)   History   Chief Complaint Chest Pain   HPI  Tracy Zhang is a 53 y.o. female with past medical history of hypertension, DVT/PE on Xarelto , GERD, and chronic pain syndrome who presents to the ED complaining of chest pain.  Patient reports that she had acute onset of dull aching pain in the left side of her chest radiating down her left arm at 1 PM this afternoon.  Pain lasted for about 30 minutes before easing off and has resolved since she has arrived to the ED.  She denies any associated difficulty breathing and was at rest at the time of onset.  She has had similar episodes of symptoms over the past couple of weeks, but had not sought care for this issue.  She has not noticed any pain or swelling in her legs and has not missed any doses of her Xarelto .     Physical Exam   Triage Vital Signs: ED Triage Vitals  Encounter Vitals Group     BP 05/08/24 1450 112/65     Girls Systolic BP Percentile --      Girls Diastolic BP Percentile --      Boys Systolic BP Percentile --      Boys Diastolic BP Percentile --      Pulse Rate 05/08/24 1450 78     Resp 05/08/24 1450 19     Temp 05/08/24 1450 97.8 F (36.6 C)     Temp Source 05/08/24 1450 Oral     SpO2 05/08/24 1450 99 %     Weight --      Height --      Head Circumference --      Peak Flow --      Pain Score 05/08/24 1453 4     Pain Loc --      Pain Education --      Exclude from Growth Chart --     Most recent vital signs: Vitals:   05/08/24 1452 05/08/24 1842  BP: 105/60 115/66  Pulse: 78 77  Resp: 16 18  Temp: 98.6 F (37 C) 98 F (36.7 C)  SpO2: 100% 97%    Constitutional: Alert and oriented. Eyes: Conjunctivae are normal. Head: Atraumatic. Nose: No congestion/rhinnorhea. Mouth/Throat: Mucous membranes are moist.  Cardiovascular: Normal rate, regular rhythm.  Grossly normal heart sounds.  2+ radial pulses bilaterally. Respiratory: Normal respiratory effort.  No retractions. Lungs CTAB. Gastrointestinal: Soft and nontender. No distention. Musculoskeletal: No lower extremity tenderness nor edema.  Neurologic:  Normal speech and language. No gross focal neurologic deficits are appreciated.    ED Results / Procedures / Treatments   Labs (all labs ordered are listed, but only abnormal results are displayed) Labs Reviewed  BASIC METABOLIC PANEL WITH GFR  CBC  TROPONIN I (HIGH SENSITIVITY)  TROPONIN I (HIGH SENSITIVITY)     EKG  ED ECG REPORT I, Carlin Palin, the attending physician, personally viewed and interpreted this ECG.   Date: 05/08/2024  EKG Time: 14:46  Rate: 80  Rhythm: normal sinus rhythm  Axis: Normal  Intervals:none  ST&T Change: Inferolateral T wave inversions  RADIOLOGY Chest x-ray reviewed and interpreted by me with no infiltrate, edema, or effusion.  PROCEDURES:  Critical Care performed: No  Procedures   MEDICATIONS ORDERED IN ED: Medications - No data to display   IMPRESSION /  MDM / ASSESSMENT AND PLAN / ED COURSE  I reviewed the triage vital signs and the nursing notes.                              53 y.o. female with past medical history of hypertension, DVT/PE on Xarelto , GERD, and chronic pain syndrome who presents to the ED complaining of chest discomfort rating down her left arm that has since resolved.  Patient's presentation is most consistent with acute presentation with potential threat to life or bodily function.  Differential diagnosis includes, but is not limited to, ACS, PE, pneumonia, pneumothorax, musculoskeletal pain, GERD, anxiety.  Patient nontoxic-appearing and in no acute distress, vital signs are unremarkable.  EKG shows inferolateral T wave inversions, new compared to EKG from 2018, but initial troponin within normal limits.  Will check second set troponin, but low suspicion  for PE given patient has been compliant with Xarelto  and symptoms are resolving with reassuring vital signs.  Chest x-ray is unremarkable and additional labs without significant anemia, leukocytosis, electrolyte abnormality, or AKI.  If repeat troponin unremarkable, then patient would be appropriate for discharge home with outpatient cardiology follow-up.  Repeat troponin within normal limits and patient remains chest pain-free on reassessment.  She is appropriate for discharge home with outpatient cardiology follow-up, referral provided.  She was counseled to return to the ED for new or worsening symptoms, patient agrees with plan.      FINAL CLINICAL IMPRESSION(S) / ED DIAGNOSES   Final diagnoses:  Nonspecific chest pain     Rx / DC Orders   ED Discharge Orders          Ordered    Ambulatory referral to Cardiology        05/08/24 1940             Note:  This document was prepared using Dragon voice recognition software and may include unintentional dictation errors.   Willo Dunnings, MD 05/08/24 [phone]

## 2024-05-08 NOTE — ED Notes (Signed)
 Pt in bed, blood drawn via butterfly from R upper arm.  Pt tolerated well.

## 2024-05-18 ENCOUNTER — Encounter: Payer: Self-pay | Admitting: *Deleted

## 2024-05-23 ENCOUNTER — Ambulatory Visit: Attending: Internal Medicine | Admitting: Internal Medicine

## 2024-05-23 ENCOUNTER — Encounter: Payer: Self-pay | Admitting: Internal Medicine

## 2024-05-23 VITALS — BP 115/78 | HR 80 | Ht 69.0 in | Wt 260.4 lb

## 2024-05-23 DIAGNOSIS — R072 Precordial pain: Secondary | ICD-10-CM

## 2024-05-23 DIAGNOSIS — R079 Chest pain, unspecified: Secondary | ICD-10-CM | POA: Diagnosis not present

## 2024-05-23 DIAGNOSIS — R0609 Other forms of dyspnea: Secondary | ICD-10-CM

## 2024-05-23 MED ORDER — NITROGLYCERIN 0.4 MG SL SUBL: 0.4000 mg | SUBLINGUAL_TABLET | SUBLINGUAL | 3 refills | Status: AC | PRN

## 2024-05-23 MED ORDER — METOPROLOL TARTRATE 100 MG PO TABS: ORAL_TABLET | ORAL | 0 refills | Status: AC

## 2024-05-23 NOTE — Patient Instructions (Addendum)
 Medication Instructions:  Your physician recommends the following medication changes.  STOP TAKING: A prescription has been sent in for Nitroglycerin .  If you have chest pain that doesn't relieve quickly, place one tablet under your tongue and allow it to dissolve.  If no relief after 5 minutes, you may take another pill.  If no relief after 5 minutes, you may take a 3rd dose but you need to call 911 and report to ER immediately.    *If you need a refill on your cardiac medications before your next appointment, please call your pharmacy*  Lab Work: Your provider would like for you to have following labs drawn today BMP.     Testing/Procedures: Your physician has requested that you have an echocardiogram. Echocardiography is a painless test that uses sound waves to create images of your heart. It provides your doctor with information about the size and shape of your heart and how well your heart's chambers and valves are working.   You may receive an ultrasound enhancing agent through an IV if needed to better visualize your heart during the echo. This procedure takes approximately one hour.  There are no restrictions for this procedure.  This will take place at [address] [facility] [address] [facility], [place] [address]  Please note: We ask at that you not bring children with you during ultrasound (echo/ vascular) testing. Due to room size and safety concerns, children are not allowed in the ultrasound rooms during exams. Our front office staff cannot provide observation of children in our lobby area while testing is being conducted. An adult accompanying a patient to their appointment will only be allowed in the ultrasound room at the discretion of the ultrasound technician under special circumstances. We apologize for any inconvenience.     Your cardiac CT will be scheduled at one of the below locations:   [facility] [address] [place], [place] [address] [phone]  OR   [facility] [address] [place], [place] [address] [phone]  If scheduled at [facility] or [facility], please arrive 15 mins early for check-in and test prep.  There is spacious parking and easy access to the radiology department from the [facility] entrance. Please enter here and check-in with the desk attendant.   Please follow these instructions carefully (unless otherwise directed):  An IV will be required for this test and Nitroglycerin  will be given.  Hold all erectile dysfunction medications at least 3 days (72 hrs) prior to test. (Ie viagra, cialis, sildenafil, tadalafil, etc)   On the Night Before the Test: Be sure to Drink plenty of water. Do not consume any caffeinated/decaffeinated beverages or chocolate 12 hours prior to your test. Do not take any antihistamines 12 hours prior to your test. If the patient has contrast allergy: Patient will need a prescription for Prednisone and very clear instructions (as follows): Prednisone 50 mg - take 13 hours prior to test Take another Prednisone 50 mg 7 hours prior to test Take another Prednisone 50 mg 1 hour prior to test Take Benadryl  50 mg 1 hour prior to test Patient must complete all four doses of above prophylactic medications. Patient will need a ride after test due to Benadryl .  On the Day of the Test: Drink plenty of water until 1 hour prior to the test. Do not eat any food 1 hour prior to test. You may take your  regular medications prior to the test.  Take metoprolol  (Lopressor ) two hours prior to test. If you take Furosemide/Hydrochlorothiazide/Spironolactone/Chlorthalidone, please HOLD on the morning of the test. Patients who wear a continuous glucose monitor MUST remove the device prior to scanning. FEMALES- please wear underwire-free bra if available, avoid dresses & tight  clothing         After the Test: Drink plenty of water. After receiving IV contrast, you may experience a mild flushed feeling. This is normal. On occasion, you may experience a mild rash up to 24 hours after the test. This is not dangerous. If this occurs, you can take Benadryl  25 mg, Zyrtec, Claritin, or Allegra and increase your fluid intake. (Patients taking Tikosyn should avoid Benadryl , and may take Zyrtec, Claritin, or Allegra) If you experience trouble breathing, this can be serious. If it is severe call 911 IMMEDIATELY. If it is mild, please call our office.  We will call to schedule your test 2-4 weeks out understanding that some insurance companies will need an authorization prior to the service being performed.   For more information and frequently asked questions, please visit our website : [email or web address]  For non-scheduling related questions, please contact the cardiac imaging nurse navigator should you have any questions/concerns: Cardiac Imaging Nurse Navigators Direct Office Dial: [phone]   For scheduling needs, including cancellations and rescheduling, please call [place], [phone].    Follow-Up: At [facility], you and your health needs are our priority.  As part of our continuing mission to provide you with exceptional heart care, our providers are all part of one team.  This team includes your primary Cardiologist (physician) and Advanced Practice Providers or APPs (Physician Assistants and Nurse Practitioners) who all work together to provide you with the care you need, when you need it.  Your next appointment:   6 week(s)  Provider:   You may see Lonni Hanson, MD or one of the following Advanced Practice Providers on your designated Care Team:   Lonni Meager, NP Lesley Maffucci, PA-C Bernardino Bring, PA-C Cadence Norphlet, PA-C Tylene Lunch, NP Barnie Hila, NP

## 2024-05-23 NOTE — Progress Notes (Unsigned)
  Cardiology Office Note:  .   Date:  05/23/2024  ID:  Tracy Zhang, DOB 08-Dec-1970, MRN [id number] PCP: Cristopher Suzen HERO, NP  [facility] Providers Cardiologist:  None { Click to update primary MD,subspecialty MD or APP then REFRESH:1}    History of Present Illness: .   Tracy Zhang is a 53 y.o. 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 Willo for evaluation of chest pain.  She presented to the [facility] emergency department on 05/08/2024 with acute onset of left-sided chest pain rating to the left arm that resolved shortly after arrival in the ED.  EKG showed normal sinus rhythm with inferolateral T wave inversions.  High-sensitivity troponin I was negative x 2.  At day of ED visit, was sitting at desk before a meeting.  Left arm got very heavy and developed tightening and sharp pain for up to a minute.  Tightness lasted total of 20 minutes.  Arm stayed heavy for 90-120 minutes.  Had recurrent tightness about 30 minutes after first episode.  This has happened 5-6x over last 8 months.  Never in high-stress situation.  Not exertional.  HR goes up quickly with ambulation.  Only SOB walking extended distances.  Mild ankle edema at times.  ROS: See HPI  Studies Reviewed: SABRA   EKG Interpretation Date/Time:  Wednesday May 23 2024 15:25:13 EDT Ventricular Rate:  80 PR Interval:  164 QRS Duration:  86 QT Interval:  378 QTC Calculation: 435 R Axis:   44  Text Interpretation: Normal sinus rhythm Nonspecific T wave abnormality Abnormal ECG When compared with ECG of 08-May-2024 14:46, Nonspecific T wave abnormality has replaced inverted T waves in Inferior leads Nonspecific T wave abnormality has replaced inverted T waves in Anterolateral leads Confirmed by Shynice Sigel, Lonni [phone]) on 05/23/2024 3:26:04 PM    *** Risk Assessment/Calculations:   {Does  this patient have ATRIAL FIBRILLATION?[phone]}         Physical Exam:   VS:  BP 115/78 (BP Location: Left Arm, Patient Position: Sitting, Cuff Size: Large)   Pulse 80   Ht 5' 9 (1.753 m)   Wt 260 lb 6.4 oz (118.1 kg)   LMP 02/25/2017   SpO2 97%   BMI 38.45 kg/m    Wt Readings from Last 3 Encounters:  05/23/24 260 lb 6.4 oz (118.1 kg)  04/14/22 205 lb 0.4 oz (93 kg)  11/18/21 205 lb 0.4 oz (93 kg)    General:  NAD. Neck: No JVD or HJR. Lungs: Clear to auscultation bilaterally without wheezes or crackles. Heart: Regular rate and rhythm without murmurs, rubs, or gallops. Abdomen: Soft, nontender, nondistended. Extremities: No lower extremity edema.  ASSESSMENT AND PLAN: .    ***    {Are you ordering a CV Procedure (e.g. stress test, cath, DCCV, TEE, etc)?   Press F2        :[id number]}  Dispo: ***  Signed, Lonni Hanson, MD

## 2024-05-24 LAB — BASIC METABOLIC PANEL WITH GFR
BUN/Creatinine Ratio: 13 (ref 9–23)
BUN: 12 mg/dL (ref 6–24)
CO2: 22 mmol/L (ref 20–29)
Calcium: 9.4 mg/dL (ref 8.7–10.2)
Chloride: 105 mmol/L (ref 96–106)
Creatinine, Ser: 0.93 mg/dL (ref 0.57–1.00)
Glucose: 81 mg/dL (ref 70–99)
Potassium: 4.3 mmol/L (ref 3.5–5.2)
Sodium: 141 mmol/L (ref 134–144)
eGFR: 73 mL/min/1.73 (ref >59)

## 2024-05-25 ENCOUNTER — Encounter: Payer: Self-pay | Admitting: Internal Medicine

## 2024-05-25 ENCOUNTER — Ambulatory Visit: Payer: Self-pay | Admitting: Internal Medicine

## 2024-06-01 ENCOUNTER — Other Ambulatory Visit: Payer: Self-pay | Admitting: Medical Genetics

## 2024-06-04 ENCOUNTER — Encounter (INDEPENDENT_AMBULATORY_CARE_PROVIDER_SITE_OTHER): Payer: Self-pay

## 2024-06-05 ENCOUNTER — Other Ambulatory Visit

## 2024-06-13 ENCOUNTER — Encounter: Payer: Self-pay | Admitting: Internal Medicine

## 2024-06-22 ENCOUNTER — Other Ambulatory Visit
Admission: RE | Admit: 2024-06-22 | Discharge: 2024-06-22 | Disposition: A | Discharge status: Home / Self Care | Payer: Self-pay | Source: Ambulatory Visit | Attending: Medical Genetics | Admitting: Medical Genetics

## 2024-07-03 LAB — GENECONNECT MOLECULAR SCREEN: Genetic Analysis Overall Interpretation: NEGATIVE

## 2024-07-04 ENCOUNTER — Other Ambulatory Visit

## 2024-07-06 ENCOUNTER — Encounter (HOSPITAL_COMMUNITY): Payer: Self-pay

## 2024-07-09 ENCOUNTER — Ambulatory Visit
Admission: RE | Admit: 2024-07-09 | Discharge: 2024-07-09 | Disposition: A | Discharge status: Home / Self Care | Source: Ambulatory Visit | Attending: Internal Medicine | Admitting: Internal Medicine

## 2024-07-09 DIAGNOSIS — R0609 Other forms of dyspnea: Secondary | ICD-10-CM | POA: Insufficient documentation

## 2024-07-09 DIAGNOSIS — R079 Chest pain, unspecified: Secondary | ICD-10-CM | POA: Insufficient documentation

## 2024-07-09 MED ORDER — METOPROLOL TARTRATE 5 MG/5ML IV SOLN
INTRAVENOUS | Status: AC
  Filled 2024-07-09: qty 10

## 2024-07-09 MED ORDER — IOHEXOL 350 MG/ML SOLN
100.0000 mL | Freq: Once | INTRAVENOUS | Status: AC | PRN
  Administered 2024-07-09: 100 mL via INTRAVENOUS

## 2024-07-09 MED ORDER — METOPROLOL TARTRATE 5 MG/5ML IV SOLN
10.0000 mg | INTRAVENOUS | Status: AC | PRN
  Administered 2024-07-09 (×2): 10 mg via INTRAVENOUS
  Filled 2024-07-09 (×3): qty 10

## 2024-07-09 MED ORDER — NITROGLYCERIN 0.4 MG SL SUBL
0.8000 mg | SUBLINGUAL_TABLET | Freq: Once | SUBLINGUAL | Status: AC
  Administered 2024-07-09: 0.8 mg via SUBLINGUAL
  Filled 2024-07-09: qty 25

## 2024-07-09 MED ORDER — DILTIAZEM HCL 25 MG/5ML IV SOLN
10.0000 mg | INTRAVENOUS | Status: AC | PRN
  Administered 2024-07-09 (×2): 10 mg via INTRAVENOUS
  Filled 2024-07-09 (×3): qty 5

## 2024-07-09 MED ORDER — METOPROLOL TARTRATE 5 MG/5ML IV SOLN
INTRAVENOUS | Status: AC
Start: 2024-07-09 — End: 2024-07-09
  Filled 2024-07-09: qty 10

## 2024-07-09 MED ORDER — DILTIAZEM HCL 25 MG/5ML IV SOLN
INTRAVENOUS | Status: AC
  Filled 2024-07-09: qty 5

## 2024-07-09 NOTE — Progress Notes (Signed)
 Patient tolerated CT well. Vital signs stable encourage to drink water throughout day.Reasons explained and verbalized understanding. Ambulated steady gait.

## 2024-07-12 ENCOUNTER — Ambulatory Visit: Attending: Internal Medicine

## 2024-07-12 DIAGNOSIS — R079 Chest pain, unspecified: Secondary | ICD-10-CM | POA: Diagnosis not present

## 2024-07-12 DIAGNOSIS — R0609 Other forms of dyspnea: Secondary | ICD-10-CM

## 2024-07-12 LAB — ECHOCARDIOGRAM COMPLETE
AR max vel: 3.52 cm2
AV Area VTI: 3.7 cm2
AV Area mean vel: 3.53 cm2
AV Mean grad: 3 mmHg
AV Peak grad: 6.2 mmHg
Ao pk vel: 1.24 m/s
Area-P 1/2: 4.68 cm2
S' Lateral: 2.9 cm

## 2024-07-18 ENCOUNTER — Ambulatory Visit: Attending: Internal Medicine | Admitting: Internal Medicine

## 2024-07-18 ENCOUNTER — Encounter: Payer: Self-pay | Admitting: Internal Medicine

## 2024-07-18 VITALS — BP 120/80 | HR 90 | Ht 68.0 in | Wt 266.2 lb

## 2024-07-18 DIAGNOSIS — I2510 Atherosclerotic heart disease of native coronary artery without angina pectoris: Secondary | ICD-10-CM

## 2024-07-18 DIAGNOSIS — Z79899 Other long term (current) drug therapy: Secondary | ICD-10-CM

## 2024-07-18 DIAGNOSIS — R0789 Other chest pain: Secondary | ICD-10-CM | POA: Diagnosis not present

## 2024-07-18 DIAGNOSIS — R0609 Other forms of dyspnea: Secondary | ICD-10-CM

## 2024-07-18 DIAGNOSIS — Z86718 Personal history of other venous thrombosis and embolism: Secondary | ICD-10-CM | POA: Diagnosis not present

## 2024-07-18 DIAGNOSIS — R911 Solitary pulmonary nodule: Secondary | ICD-10-CM

## 2024-07-18 NOTE — Progress Notes (Unsigned)
 Cardiology Office Note:  .   Date:  07/20/2024  ID:  Tracy Zhang, DOB [birth year], MRN [id number] PCP: Cristopher Suzen HERO, NP  [facility] Providers Cardiologist:  None     History of Present Illness: .   Tracy Zhang is a 53 y.o. 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  I met her in August after ED visit for acute left-sided chest pain and left arm heaviness.  Subsequent coronary CTA showed mild CAD with 25-49% stenosis in the mid LAD as well as less than 25% stenosis in the proximal and mid RCA.  Calcium score was 67.  Incidental note was made of 4 mm right lower lobe lung nodule and a tiny hiatal hernia.  Echocardiogram showed normal LVEF with mild mitral regurgitation.  No significant abnormalities were seen.  Today, Tracy Zhang reports that she has been feeling fairly well though she recalls 1 episode of chest pain and left arm heaviness about a week ago.  This resolved within 5 minutes.  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  She denies any exertional chest or left arm pain but has noticed worsening exertional dyspnea when going up stairs over the last 8 months.  She denies dyspnea at rest as well as lower extremity edema, palpitations, and lightheadedness/dizziness.  ROS: See HPI  Studies Reviewed: SABRA       TTE (07/12/2024): Normal LV size and wall thickness.  LVEF 60-65% with normal wall motion and diastolic function.  Normal RV size and function.  Normal biatrial size.  Mild mitral and tricuspid regurgitation.  Normal aortic valve.  Normal CVP.  Coronary CTA (07/09/2024): Mild CAD with 25-49% stenosis of the mid LAD and less than 25% stenosis of the proximal and mid RCA.  Coronary calcium score 67 (93rd percentile for age and sex matched controls).  Incidental note made of 4 mm right lower lobe nodule as well as tiny  hiatal hernia.  Risk Assessment/Calculations:         Physical Exam:   VS:  BP 120/80 (BP Location: Left Arm, Patient Position: Sitting, Cuff Size: Normal)   Pulse 90   Ht 5' 8 (1.727 m)   Wt 266 lb 3.2 oz (120.7 kg)   LMP 02/25/2017   SpO2 97%   BMI 40.48 kg/m    Wt Readings from Last 3 Encounters:  07/18/24 266 lb 3.2 oz (120.7 kg)  05/23/24 260 lb 6.4 oz (118.1 kg)  04/14/22 205 lb 0.4 oz (93 kg)    General:  NAD. Neck: No JVD or HJR. Lungs: Clear to auscultation bilaterally without wheezes or crackles. Heart: Regular rate and rhythm without murmurs, rubs, or gallops. Abdomen: Soft, nontender, nondistended. Extremities: No lower extremity edema.  ASSESSMENT AND PLAN: .    Atypical chest pain and dyspnea on exertion with nonobstructive coronary artery disease: Recent CTA chest and echocardiogram were reassuring without findings to explain her symptoms.  We reviewed the findings of her coronary CTA, which showed nonobstructive CAD.  We will focus on continued optimization of her medical therapy; I will check a lipid panel and ALT today to assess her response to atorvastatin and increase this if necessary to target an LDL less than 70.  I encouraged her to keep working on lifestyle modifications to help improve her weight and functional capacity.  History of VTE: No evidence of right heart failure  on recent echo in the setting of prior DVT/PE.  Continue anticoagulation at the direction of her PCP.  Pulmonary nodule: 4 mm right lower lobe along nodule incidentally noted on recent coronary CTA.  Given history of tobacco use in remission, we will obtain an unenhanced CT of the chest in 6 months to ensure stability.  Morbid obesity: BMI greater than 40, likely contributing to some of her exertional dyspnea.  Weight loss encouraged through diet and exercise.    Dispo: Return to clinic in 6 months.  Signed, Lonni Hanson, MD

## 2024-07-18 NOTE — Patient Instructions (Addendum)
 Medication Instructions:  Your physician recommends that you continue on your current medications as directed. Please refer to the Current Medication list given to you today.   *If you need a refill on your cardiac medications before your next appointment, please call your pharmacy*  Lab Work:  LIPID PANEL & ALT   If you have labs (blood work) drawn today and your tests are completely normal, you will receive your results only by: MyChart Message (if you have MyChart) OR A paper copy in the mail If you have any lab test that is abnormal or we need to change your treatment, we will call you to review the results.  Testing/Procedures:  Chest CT in April, 2026  scanning, (CAT scanning), is a noninvasive, special x-ray that produces cross-sectional images of the body using x-rays and a computer. CT scans help physicians diagnose and treat medical conditions. For some CT exams, a contrast material is used to enhance visibility in the area of the body being studied. CT scans provide greater clarity and reveal more details than regular x-ray exams.  [facility] [address]  [place], [place] [address]  Or  [facility]  [address]  [place], [place] [address]   Follow-Up: At [facility], you and your health needs are our priority.  As part of our continuing mission to provide you with exceptional heart care, our providers are all part of one team.  This team includes your primary Cardiologist (physician) and Advanced Practice Providers or APPs (Physician Assistants and Nurse Practitioners) who all work together to provide you with the care you need, when you need it.  Your next appointment:   6 month(s)  Provider:   You may see Lonni Hanson, MD or one of the following Advanced Practice Providers on your designated Care Team:   Lonni Meager, NP Lesley Maffucci, PA-C Bernardino Bring, PA-C Cadence [place], PA-C Tylene Lunch,  NP Barnie Hila, NP We recommend signing up for the patient portal called MyChart.  Sign up information is provided on this After Visit Summary.  MyChart is used to connect with patients for Virtual Visits (Telemedicine).  Patients are able to view lab/test results, encounter notes, upcoming appointments, etc.  Non-urgent messages can be sent to your provider as well.   To learn more about what you can do with MyChart, go to [email or web address].

## 2024-07-19 ENCOUNTER — Ambulatory Visit: Payer: Self-pay | Admitting: Internal Medicine

## 2024-07-19 LAB — LIPID PANEL
Chol/HDL Ratio: 3 ratio (ref 0.0–4.4)
Cholesterol, Total: 136 mg/dL (ref 100–199)
HDL: 45 mg/dL (ref >39)
LDL Chol Calc (NIH): 57 mg/dL (ref 0–99)
Triglycerides: 213 mg/dL — ABNORMAL HIGH (ref 0–149)
VLDL Cholesterol Cal: 34 mg/dL (ref 5–40)

## 2024-07-19 LAB — ALT: ALT: 20 IU/L (ref 0–32)

## 2024-07-20 ENCOUNTER — Encounter: Payer: Self-pay | Admitting: Internal Medicine

## 2024-07-20 DIAGNOSIS — R911 Solitary pulmonary nodule: Secondary | ICD-10-CM | POA: Insufficient documentation

## 2024-07-20 DIAGNOSIS — R0789 Other chest pain: Secondary | ICD-10-CM | POA: Insufficient documentation

## 2024-07-20 DIAGNOSIS — Z86718 Personal history of other venous thrombosis and embolism: Secondary | ICD-10-CM | POA: Insufficient documentation

## 2024-07-20 DIAGNOSIS — I2510 Atherosclerotic heart disease of native coronary artery without angina pectoris: Secondary | ICD-10-CM | POA: Insufficient documentation

## 2024-07-20 DIAGNOSIS — R0609 Other forms of dyspnea: Secondary | ICD-10-CM | POA: Insufficient documentation

## 2024-12-28 ENCOUNTER — Ambulatory Visit: Admitting: Student

## 2025-01-14 ENCOUNTER — Ambulatory Visit
